# Patient Record
Sex: Female | Born: 1975 | Race: White | Hispanic: No | Marital: Single | Smoking: Former smoker
Health system: Southern US, Community
[De-identification: ages and names within clinical notes are randomized; demographics above are authoritative.]

## PROBLEM LIST (undated history)

## (undated) DIAGNOSIS — M87059 Idiopathic aseptic necrosis of unspecified femur: Secondary | ICD-10-CM

## (undated) DIAGNOSIS — F3181 Bipolar II disorder: Secondary | ICD-10-CM

## (undated) DIAGNOSIS — F329 Major depressive disorder, single episode, unspecified: Secondary | ICD-10-CM

## (undated) DIAGNOSIS — Z9889 Other specified postprocedural states: Secondary | ICD-10-CM

## (undated) DIAGNOSIS — Z973 Presence of spectacles and contact lenses: Secondary | ICD-10-CM

## (undated) DIAGNOSIS — F909 Attention-deficit hyperactivity disorder, unspecified type: Secondary | ICD-10-CM

## (undated) DIAGNOSIS — R112 Nausea with vomiting, unspecified: Secondary | ICD-10-CM

## (undated) DIAGNOSIS — F32A Depression, unspecified: Secondary | ICD-10-CM

## (undated) DIAGNOSIS — F17200 Nicotine dependence, unspecified, uncomplicated: Secondary | ICD-10-CM

## (undated) HISTORY — DX: Attention-deficit hyperactivity disorder, unspecified type: F90.9

## (undated) HISTORY — DX: Idiopathic aseptic necrosis of unspecified femur: M87.059

## (undated) HISTORY — PX: BREAST BIOPSY: SHX20

## (undated) HISTORY — DX: Major depressive disorder, single episode, unspecified: F32.9

## (undated) HISTORY — PX: DILATION AND CURETTAGE OF UTERUS: SHX78

## (undated) HISTORY — DX: Bipolar II disorder: F31.81

## (undated) HISTORY — PX: SHOULDER SURGERY: SHX246

## (undated) HISTORY — PX: WISDOM TOOTH EXTRACTION: SHX21

## (undated) HISTORY — PX: HIP ARTHROSCOPY: SHX668

## (undated) HISTORY — PX: TONSILLECTOMY: SUR1361

## (undated) HISTORY — DX: Depression, unspecified: F32.A

---
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
## 1998-06-15 HISTORY — PX: APPENDECTOMY: SHX54

## 2001-11-15 ENCOUNTER — Other Ambulatory Visit: Admission: RE | Admit: 2001-11-15 | Discharge: 2001-11-15 | Payer: Self-pay | Admitting: Gynecology

## 2002-11-17 ENCOUNTER — Other Ambulatory Visit: Admission: RE | Admit: 2002-11-17 | Discharge: 2002-11-17 | Payer: Self-pay | Admitting: Gynecology

## 2003-01-07 ENCOUNTER — Encounter: Payer: Self-pay | Admitting: Emergency Medicine

## 2003-01-07 ENCOUNTER — Emergency Department (HOSPITAL_COMMUNITY): Admission: EM | Admit: 2003-01-07 | Discharge: 2003-01-07 | Payer: Self-pay | Admitting: Emergency Medicine

## 2004-12-26 ENCOUNTER — Other Ambulatory Visit: Admission: RE | Admit: 2004-12-26 | Discharge: 2004-12-26 | Payer: Self-pay | Admitting: Gynecology

## 2005-10-17 ENCOUNTER — Emergency Department (HOSPITAL_COMMUNITY): Admission: EM | Admit: 2005-10-17 | Discharge: 2005-10-17 | Payer: Self-pay | Admitting: Emergency Medicine

## 2005-12-28 ENCOUNTER — Other Ambulatory Visit: Admission: RE | Admit: 2005-12-28 | Discharge: 2005-12-28 | Payer: Self-pay | Admitting: Gynecology

## 2006-02-08 ENCOUNTER — Ambulatory Visit (HOSPITAL_COMMUNITY): Admission: RE | Admit: 2006-02-08 | Discharge: 2006-02-08 | Payer: Self-pay | Admitting: Internal Medicine

## 2006-06-15 DIAGNOSIS — M87059 Idiopathic aseptic necrosis of unspecified femur: Secondary | ICD-10-CM

## 2006-06-15 HISTORY — DX: Idiopathic aseptic necrosis of unspecified femur: M87.059

## 2006-10-05 ENCOUNTER — Ambulatory Visit (HOSPITAL_COMMUNITY): Admission: RE | Admit: 2006-10-05 | Discharge: 2006-10-05 | Payer: Self-pay | Admitting: Orthopaedic Surgery

## 2006-12-30 ENCOUNTER — Other Ambulatory Visit: Admission: RE | Admit: 2006-12-30 | Discharge: 2006-12-30 | Payer: Self-pay | Admitting: Gynecology

## 2008-01-09 ENCOUNTER — Other Ambulatory Visit: Admission: RE | Admit: 2008-01-09 | Discharge: 2008-01-09 | Payer: Self-pay | Admitting: Gynecology

## 2008-03-08 ENCOUNTER — Encounter (HOSPITAL_COMMUNITY): Admission: RE | Admit: 2008-03-08 | Discharge: 2008-05-24 | Payer: Self-pay | Admitting: Internal Medicine

## 2008-05-18 ENCOUNTER — Ambulatory Visit: Payer: Self-pay | Admitting: Women's Health

## 2008-08-02 ENCOUNTER — Ambulatory Visit: Payer: Self-pay | Admitting: Women's Health

## 2009-01-09 ENCOUNTER — Other Ambulatory Visit: Admission: RE | Admit: 2009-01-09 | Discharge: 2009-01-09 | Payer: Self-pay | Admitting: Gynecology

## 2009-01-09 ENCOUNTER — Encounter: Payer: Self-pay | Admitting: Women's Health

## 2009-01-09 ENCOUNTER — Ambulatory Visit: Payer: Self-pay | Admitting: Women's Health

## 2009-01-21 ENCOUNTER — Encounter: Admission: RE | Admit: 2009-01-21 | Discharge: 2009-01-21 | Payer: Self-pay | Admitting: Gynecology

## 2009-05-14 ENCOUNTER — Ambulatory Visit: Payer: Self-pay | Admitting: Gynecology

## 2009-06-15 HISTORY — PX: BREAST EXCISIONAL BIOPSY: SUR124

## 2009-06-15 HISTORY — PX: BREAST SURGERY: SHX581

## 2009-06-28 ENCOUNTER — Other Ambulatory Visit: Admission: RE | Admit: 2009-06-28 | Discharge: 2009-06-28 | Payer: Self-pay | Admitting: General Surgery

## 2010-01-27 ENCOUNTER — Ambulatory Visit: Payer: Self-pay | Admitting: Women's Health

## 2010-01-27 ENCOUNTER — Other Ambulatory Visit: Admission: RE | Admit: 2010-01-27 | Discharge: 2010-01-27 | Payer: Self-pay | Admitting: Gynecology

## 2010-04-01 ENCOUNTER — Ambulatory Visit: Payer: Self-pay | Admitting: Gynecology

## 2010-04-07 ENCOUNTER — Encounter: Admission: RE | Admit: 2010-04-07 | Discharge: 2010-04-07 | Payer: Self-pay | Admitting: Gynecology

## 2010-05-09 ENCOUNTER — Encounter: Admission: RE | Admit: 2010-05-09 | Discharge: 2010-05-09 | Payer: Self-pay | Admitting: Internal Medicine

## 2010-07-06 ENCOUNTER — Encounter: Payer: Self-pay | Admitting: Internal Medicine

## 2010-11-24 ENCOUNTER — Emergency Department (HOSPITAL_COMMUNITY): Payer: BC Managed Care – PPO

## 2010-11-24 ENCOUNTER — Emergency Department (HOSPITAL_COMMUNITY)
Admission: EM | Admit: 2010-11-24 | Discharge: 2010-11-25 | Disposition: A | Discharge status: Other Acute Inpatient Hospital | Payer: BC Managed Care – PPO | Source: Home / Self Care | Attending: Emergency Medicine | Admitting: Emergency Medicine

## 2010-11-24 LAB — DIFFERENTIAL
Basophils Absolute: 0 10*3/uL (ref 0.0–0.1)
Basophils Relative: 0 % (ref 0–1)
Eosinophils Absolute: 0 10*3/uL (ref 0.0–0.7)
Lymphocytes Relative: 19 % (ref 12–46)
Lymphs Abs: 1.5 10*3/uL (ref 0.7–4.0)
Monocytes Absolute: 0.5 10*3/uL (ref 0.1–1.0)
Monocytes Relative: 6 % (ref 3–12)
Neutro Abs: 5.8 10*3/uL (ref 1.7–7.7)
Neutrophils Relative %: 74 % (ref 43–77)

## 2010-11-24 LAB — CBC
HCT: 41.8 % (ref 36.0–46.0)
Hemoglobin: 14.4 g/dL (ref 12.0–15.0)
MCH: 29.4 pg (ref 26.0–34.0)
MCHC: 34.4 g/dL (ref 30.0–36.0)
MCV: 85.3 fL (ref 78.0–100.0)
Platelets: 178 10*3/uL (ref 150–400)
RBC: 4.9 MIL/uL (ref 3.87–5.11)
RDW: 12.2 % (ref 11.5–15.5)

## 2010-11-24 LAB — POCT PREGNANCY, URINE: Preg Test, Ur: NEGATIVE

## 2010-11-24 LAB — BASIC METABOLIC PANEL
CO2: 24 mEq/L (ref 19–32)
Calcium: 9.3 mg/dL (ref 8.4–10.5)
Chloride: 101 mEq/L (ref 96–112)
Creatinine, Ser: 0.51 mg/dL (ref 0.4–1.2)
GFR calc Af Amer: >60 mL/min (ref >60)
GFR calc non Af Amer: >60 mL/min (ref >60)
Glucose, Bld: 120 mg/dL — ABNORMAL HIGH (ref 70–99)
Potassium: 3.4 mEq/L — ABNORMAL LOW (ref 3.5–5.1)

## 2010-11-24 LAB — URINALYSIS, ROUTINE W REFLEX MICROSCOPIC
Glucose, UA: NEGATIVE mg/dL
Hgb urine dipstick: NEGATIVE
Ketones, ur: 40 mg/dL — AB
Leukocytes, UA: NEGATIVE
Nitrite: NEGATIVE
Specific Gravity, Urine: 1.035 — ABNORMAL HIGH (ref 1.005–1.030)
Urobilinogen, UA: 0.2 mg/dL (ref 0.0–1.0)

## 2010-11-24 LAB — ETHANOL: Alcohol, Ethyl (B): <11 mg/dL — ABNORMAL HIGH (ref 0–10)

## 2010-11-24 LAB — AMMONIA: Ammonia: 26 umol/L (ref 11–60)

## 2010-11-25 ENCOUNTER — Inpatient Hospital Stay (HOSPITAL_COMMUNITY)
Admission: AD | Admit: 2010-11-25 | Discharge: 2010-11-25 | DRG: 430 | Disposition: A | Discharge status: Other Acute Inpatient Hospital | Payer: BC Managed Care – PPO | Source: Ambulatory Visit | Attending: Psychiatry | Admitting: Psychiatry

## 2010-11-25 ENCOUNTER — Inpatient Hospital Stay (HOSPITAL_COMMUNITY)
Admission: EM | Admit: 2010-11-25 | Discharge: 2010-11-27 | DRG: 017 | Disposition: A | Discharge status: Home / Self Care | Payer: BC Managed Care – PPO | Attending: Internal Medicine | Admitting: Internal Medicine

## 2010-11-25 ENCOUNTER — Emergency Department (HOSPITAL_COMMUNITY): Payer: BC Managed Care – PPO

## 2010-11-25 DIAGNOSIS — F17200 Nicotine dependence, unspecified, uncomplicated: Secondary | ICD-10-CM | POA: Diagnosis present

## 2010-11-25 DIAGNOSIS — G894 Chronic pain syndrome: Secondary | ICD-10-CM | POA: Diagnosis present

## 2010-11-25 DIAGNOSIS — F319 Bipolar disorder, unspecified: Secondary | ICD-10-CM

## 2010-11-25 DIAGNOSIS — T403X5A Adverse effect of methadone, initial encounter: Secondary | ICD-10-CM | POA: Diagnosis present

## 2010-11-25 DIAGNOSIS — F909 Attention-deficit hyperactivity disorder, unspecified type: Secondary | ICD-10-CM | POA: Diagnosis present

## 2010-11-25 DIAGNOSIS — G9349 Other encephalopathy: Principal | ICD-10-CM | POA: Diagnosis present

## 2010-11-25 DIAGNOSIS — M87059 Idiopathic aseptic necrosis of unspecified femur: Secondary | ICD-10-CM | POA: Diagnosis present

## 2010-11-25 DIAGNOSIS — F312 Bipolar disorder, current episode manic severe with psychotic features: Secondary | ICD-10-CM

## 2010-11-25 DIAGNOSIS — Z79899 Other long term (current) drug therapy: Secondary | ICD-10-CM

## 2010-11-25 DIAGNOSIS — F29 Unspecified psychosis not due to a substance or known physiological condition: Principal | ICD-10-CM

## 2010-11-25 DIAGNOSIS — F39 Unspecified mood [affective] disorder: Secondary | ICD-10-CM

## 2010-11-25 LAB — RAPID URINE DRUG SCREEN, HOSP PERFORMED
Amphetamines: POSITIVE — AB
Barbiturates: NOT DETECTED
Benzodiazepines: NOT DETECTED
Cocaine: NOT DETECTED
Opiates: NOT DETECTED

## 2010-11-25 LAB — POCT I-STAT, CHEM 8
BUN: 8 mg/dL (ref 6–23)
Calcium, Ion: 1.14 mmol/L (ref 1.12–1.32)
Chloride: 107 mEq/L (ref 96–112)
Creatinine, Ser: 0.5 mg/dL (ref 0.4–1.2)
Glucose, Bld: 106 mg/dL — ABNORMAL HIGH (ref 70–99)
HCT: 44 % (ref 36.0–46.0)
Potassium: 3.6 mEq/L (ref 3.5–5.1)

## 2010-11-26 DIAGNOSIS — F39 Unspecified mood [affective] disorder: Secondary | ICD-10-CM

## 2010-11-26 LAB — DIFFERENTIAL
Basophils Relative: 0 % (ref 0–1)
Eosinophils Relative: 0 % (ref 0–5)
Lymphocytes Relative: 29 % (ref 12–46)
Lymphs Abs: 2.9 10*3/uL (ref 0.7–4.0)
Monocytes Absolute: 0.8 10*3/uL (ref 0.1–1.0)
Monocytes Relative: 8 % (ref 3–12)
Neutro Abs: 6.2 10*3/uL (ref 1.7–7.7)
Neutrophils Relative %: 63 % (ref 43–77)

## 2010-11-26 LAB — COMPREHENSIVE METABOLIC PANEL
Albumin: 3.3 g/dL — ABNORMAL LOW (ref 3.5–5.2)
Alkaline Phosphatase: 91 U/L (ref 39–117)
BUN: 6 mg/dL (ref 6–23)
Calcium: 8.8 mg/dL (ref 8.4–10.5)
Glucose, Bld: 119 mg/dL — ABNORMAL HIGH (ref 70–99)
Potassium: 3.4 mEq/L — ABNORMAL LOW (ref 3.5–5.1)
Total Protein: 5.9 g/dL — ABNORMAL LOW (ref 6.0–8.3)

## 2010-11-26 LAB — CBC
MCH: 29.3 pg (ref 26.0–34.0)
Platelets: 161 10*3/uL (ref 150–400)
RBC: 4.44 MIL/uL (ref 3.87–5.11)
RDW: 12.5 % (ref 11.5–15.5)
WBC: 9.9 10*3/uL (ref 4.0–10.5)

## 2010-11-26 LAB — T4: T4, Total: 4.8 ug/dL — ABNORMAL LOW (ref 5.0–12.5)

## 2010-11-26 LAB — RPR: RPR Ser Ql: NONREACTIVE

## 2010-11-26 LAB — LACTATE DEHYDROGENASE: LDH: 132 U/L (ref 94–250)

## 2010-11-26 LAB — PHOSPHORUS: Phosphorus: 2.2 mg/dL — ABNORMAL LOW (ref 2.3–4.6)

## 2010-11-26 LAB — FERRITIN: Ferritin: 76 ng/mL (ref 10–291)

## 2010-11-26 LAB — LIPID PANEL
Cholesterol: 166 mg/dL (ref 0–200)
Triglycerides: 89 mg/dL (ref <150)

## 2010-11-26 LAB — T3, FREE: T3, Free: 2.1 pg/mL — ABNORMAL LOW (ref 2.3–4.2)

## 2010-11-26 LAB — HEMOGLOBIN A1C: Hgb A1c MFr Bld: 5.4 % (ref <5.7)

## 2010-11-26 LAB — TSH: TSH: 1.135 u[IU]/mL (ref 0.350–4.500)

## 2010-11-26 LAB — LACTIC ACID, PLASMA: Lactic Acid, Venous: 0.7 mmol/L (ref 0.5–2.2)

## 2010-11-26 LAB — MAGNESIUM: Magnesium: 1.9 mg/dL (ref 1.5–2.5)

## 2010-11-27 LAB — FOLATE RBC: RBC Folate: 1115 ng/mL — ABNORMAL HIGH (ref >=366)

## 2010-12-02 NOTE — Consult Note (Signed)
  Alice [name], FORKER             ACCOUNT NO.:  [id number]  MEDICAL RECORD NO.:  [id number]  LOCATION:  1510                         FACILITY:  [facility]  PHYSICIAN:  Eulogio Ditch, MD DATE OF BIRTH:  31-Aug-1975  DATE OF CONSULTATION:  11/26/2010 DATE OF DISCHARGE:                                CONSULTATION   REASON FOR CONSULTATION:  History of bipolar disorder/confusion/psychotic behavior.  HISTORY OF PRESENT ILLNESS:  This is a 35 year old female with a history of bipolar disorder and chronic pain along with right hip avascular necrosis.  The patient came to the ER yesterday and was admitted to [facility] for disorganized behavior.  Because of the family concern, the patient was sent back to ER and admitted on the medical floor to rule out stroke.  Her CT scan was negative when she was at [facility] and all her labs were within normal limits.  Today when I saw the patient, the patient is very logical and goal directed, not internally preoccupied, not delusional or hallucinating. The patient denied any overdose of the medications.  The patient reported that recently she was started on methadone for 2-1/2 weeks 5 mg 3 times a day and she was taking as prescribed medications along with Xanax, Adderall, and Cymbalta and she do not know what happened and why she became so confused.  Again, the patient stressed that it was not a suicide attempt and she was not overmedicating herself.  The patient follows Dr. Ellis Savage as an outpatient psychiatrist.  MENTAL STATUS EXAMINATION:  The patient is very calm and cooperative during the interview, logical, and goal directed.  No flight of ideas present.  Not delusional or hallucinating, not internally preoccupied, alert, awake, and oriented x3.  Attention and concentration good. Memory, immediate, recent, and remote intact.  Fund of knowledge fair. Insight and judgment intact.  DIAGNOSES:  Axis I:  As per history,  bipolar disorder. Axis II:  Deferred. Axis III:  See medical notes. Axis IV:  Unspecified. Axis V:  50.  RECOMMENDATIONS: 1. The patient at this time is cleared from Psychiatry to follow up in     the outpatient setting.  She is not acutely     manic or psychotic, to be admitted in the inpatient setting at     [facility]. 2. The patient's family, the boyfriend and mother were agreeable for     the treatment plan.  Thank you for involving me in taking care of this patient.     Eulogio Ditch, MD     SA/MEDQ  D:  11/26/2010  T:  11/26/2010  Job:  [id number]  Electronically Signed by Eulogio Ditch  on 12/02/2010 09:56:37 AM

## 2010-12-18 NOTE — Discharge Summary (Signed)
Alice [name], Alice [name]             ACCOUNT NO.:  [id number]  MEDICAL RECORD NO.:  [id number]  LOCATION:  1510                         FACILITY:  [facility]  PHYSICIAN:  Clydia Llano, MD       DATE OF BIRTH:  1975-09-20  DATE OF ADMISSION:  11/25/2010 DATE OF DISCHARGE:                              DISCHARGE SUMMARY   PRIMARY CARE PHYSICIAN:  Gwen Pounds, MD  REASON FOR ADMISSION:  Encephalopathy.  DISCHARGE DIAGNOSES: 1. Drug-induced encephalopathy, resolved. 2. Bipolar affective disorder. 3. Chronic pain syndrome. 4. Right hip avascular necrosis. 5. Attention deficit hyperactivity disorder. 6. Tobacco abuse.  DISCHARGE MEDICATIONS: 1. Adderall XR 30 mg p.o. daily. 2. Cymbalta 60 mg p.o. b.i.d. 3. Lamictal 25 mg p.o. b.i.d. 4. Methadone 5 mg p.o. three times daily, do not take it until she     sees pain management clinic. 5. Nucynta 75 mg 2 tablets four times daily. 6. Xanax 1 mg p.o. three times a day.  BRIEF HISTORY EXAMINATION:  Alice [name] is a 35 year old female with past medical history of bipolar disorder; chronic pain, on chronic narcotics. The patient has right hip avascular necrosis who was started on methadone 2-1/2 weeks prior to admission.  The patient brought to the hospital because of odd behavior.  The patient has been persistently sleepy and lethargic.  Unable to talk or eat or drink anything 3 days prior to admission.  The patient brought to the emergency department here, we evaluated with CT scan which was negative.  Because of history of bipolar disorder, the patient was transferred to [facility].  The patient continued to act not herself and she has some facial drooling.  So because of concern of family, the patient was sent back again to the emergency department.  The patient remained lethargic at the time of reassessment in the emergency department.  MRI was done which was negative.  The patient admitted to the hospital for  further evaluation.  RADIOLOGY: 1. MRI of the brain November 25, 2010, showed negative exam within limits     of motion degraded study.  There is no visible changes from the     prior CT on November 24, 2010. 2. CT head on November 24, 2010, showed negative CT head.  BRIEF HOSPITAL COURSE: 1. Encephalopathy, drug induced, likely secondary to the methadone.     The patient is taking the methadone for around 17 days and then she     had total lethargy with altered level of consciousness.  The     patient was off the methadone for 2 days and she regained full     consciousness and she is back to herself.  The patient instructed     not to take the methadone until she sees her pain medication clinic     as soon as possible.  The CT scan and the MRI was negative for any     intracranial abnormalities.  The patient has no signs or symptoms     or lab findings that supports infection or other pathology. 2. Bipolar disorder.  The patient seems she is in manic phase but she     was evaluated by Dr.  Ahluwalia from Psychiatry who found the     patient to be logical, goal directed, not internally preoccupied or     delusional and he cleared her to continue to follow up with     outpatient psychiatry.  The patient to follow up with her primary     psychiatrist for this. 3. Chronic pain/chronic narcotic use.  The patient will be on Nucynta.     The patient has been 3 days off the methadone.  It is thought to be     that 5 mg 3 times a day is high dose for her and likely the patient     has build up of methadone in her system.  The patient off methadone     practically since Sunday, so for now for 5 days.  The patient     instructed not to take her methadone until she sees her pain     management clinic.  The patient states she has some Nucynta left at     home and she can manage until she sees pain management clinic.  DISCHARGE INSTRUCTIONS: 1. Activity, as tolerated. 2. Disposition, home. 3. Diet,  regular.     Clydia Llano, MD     ME/MEDQ  D:  11/27/2010  T:  11/27/2010  Job:  [id number]  cc:   Gwen Pounds, MD Fax: [phone]  Ellis Savage, FNP Fax: [phone]  Electronically Signed by Clydia Llano  on 12/18/2010 01:55:15 PM

## 2010-12-23 NOTE — H&P (Signed)
Alice [name], [name]             ACCOUNT NO.:  [id number]  MEDICAL RECORD NO.:  [id number]  LOCATION:  WLED                         FACILITY:  [facility]  PHYSICIAN:  Valetta Close, M.D.   DATE OF BIRTH:  Apr 03, 1976  DATE OF ADMISSION:  11/25/2010 DATE OF DISCHARGE:                             HISTORY & PHYSICAL   CHIEF COMPLAINT:  Encephalopathy.  HISTORY OF PRESENT ILLNESS:  This is a 35 year old female with a history of bipolar disorder, chronic pain, and right hip avascular necrosis who was started on methadone 2-1/2 weeks ago for avascular necrosis.  She has failed other pain regimen.  She has also been on a regimen of gradually increasing her Lamictal because at a higher dose it caused her rash, but they have been trying to get her back on it.  Over the last 2- 1/2 weeks, she has been getting gradually more sleep, although that is persistent problem for her but over the last 2 days, she has gotten much more sleepy, unable to talk, not eating or drinking anything practically for 3 days and just certainly not acting herself.  Because she got to the point where she was so bad, they brought her to the emergency room and she was admitted to [facility] after a negative CT and otherwise negative workup.  However, at [facility] today, she has continued to not act herself.  She is contorting her face to the right looking upward to the right.  Family was concerned for a stroke and because of this, she was referred back to the emergency room where an MRI has been negative but she remained encephalopathic, not doing well with the Mini-Mental status exam and continues to contort her face, just act generally bizarre per her family and not acting herself, so she is referred to [place] for admission.  She has had decreased bowel movements, decreased urination, decreased p.o. intake, but she has had no fevers; however, she has been somewhat diaphoretic.  There have been no changes in  her skin tone, no sick contacts, no exposures to tics, no recent travel.  PAST MEDICAL HISTORY:  Bipolar disorder, chronic pain from a right hip AVN and a suspicion that she may have had fibromyalgia, ADHD.  She recently stopped taking her birth control pills about 2-1/2 weeks ago. Tobacco abuse.  No alcohol.  No controlled substances.  She is a full code.  FAMILY HISTORY:  Performed and notable for Parkinson disease.  She presents with multiple family members and has a very good support system.  REVIEW OF SYSTEMS:  A 10-point review of system was performed and negative for other than as per the HPI.  PHYSICAL EXAMINATION:  VITAL SIGNS:  Temperature 99.2, heart rate 84, blood pressure 133/86, O2 sat 100% on room air, respiratory rate is 20. Fingerstick blood glucose was 77. GENERAL:  She appears her stated age and is in no apparent distress. She does have somewhat tanned skin that family states is at her baseline. HEENT:  She has no scleral icterus. LUNGS:  Clear to auscultation bilaterally.  She is able to speak in full sentences without difficulty. CARDIAC:  Regular rate and rhythm with no murmur. ABDOMEN:  Bowel sounds  are positive.  No tenderness, rebound, or guarding.  No hepatosplenomegaly. EXTREMITIES:  No edema. GU:  No CVA tenderness. RECTAL:  Deferred. NEURO:  She is alert, oriented to person, place, and time.  She is a bit slow to answer questions.  She does follow commands, but again with difficulty and this difficulty is inconsistent.  She does not have cogwheel rigidity and she does not have asterixis.  She has good strength and normal muscle tone.  There is no neck stiffness and no neck tenderness.  She has negative meningeal signs.  At rest though, she does tend to flex her chin to the right and she looks also to the right, although the upper outer quadrant is where she looks but with redirection she will look straight ahead and again appear normal.   She recognizes when asked that this would be an uncomfortable position, but she does not know that it is uncomfortable and she does sometime laugh inappropriately, though I would not say historically.  She remains quite pleasant.  Family says she is gradually getting a little better, but she is still nowhere near her baseline.  LABORATORY DATA:  Sodium 140, potassium 3.6, chloride 107, bicarb 20 with anion gap 13, BUN 8, creatinine 0.5, glucose 106.  White count 7.8, hemoglobin 14, hematocrit 42, platelets 178.  MRI of the head is negative.  CT of the head is negative.  UDS is positive for amphetamines only, not positive for opioids, although she is on methadone.  The UA is negative.  A urine pregnancy is negative.  Her ammonia level is normal.  ASSESSMENT AND PLAN: 1. Acute encephalopathy.  Certainly, it appears that this is     medication side effects.  There may be another medication taken     that is not on her list, but the family seems to really note she is     taking and they believe the list is complete.  Either way, I did     discuss the case with Psychiatry.  We are going to put her on     Benadryl.  I am going to start her on Lamictal, but at the lowest     dose 25.  I am going to stop her Adderall completely.  I am going     to stop her Cymbalta completely as well.  I will put her on Xanax 1     mg twice a day to prevent benzo withdrawal.  I am going to give her     Tylenol.  I am just going to hydrate her and provide supportive     care.  If her symptoms persist or her lab work worsens, especially     her bicarb level, then we may need a Neurology consult and perform     further test.  I am going to screen for hemochromatosis.  I am     going to screen for Wilson disease with ferritin and ceruloplasmin     respectively.  I am going to check ammonia and LDH.  I am going to     screen for thyroid functions knowing that she has an office visit     for a TSH level that was  abnormal in September 2009.  When I look     in the computer system, unfortunately I do not have access to     whatever that test was, so I am going to screen her for thyroid     disease, although again  she has no proptosis, but again we will     screen for thyroid disease, cannot say about the medication would     make this test abnormal.  Psychiatry has been consulted and their     assistance is appreciated.  They will come by tomorrow and I did     discuss the case over the phone with them today and again if she     does not improve, a Neurology consult may be indicated. 2. Bipolar disorder.  Again per psych, we will determine whether she     is to be discharged from the hospital to [facility] and we     will see if we can may be put her on some alternative medications     but I will defer that to them. 3. Tobacco abuse.  I will put her on a NicoDerm patch. 4. Disposition.  We will keep her on telemetry floor for at least 24     hours to make sure heart rate is okay before stopping the     telemetry.  I did discuss with the husband the concern for taking     birth control while smoking and the increased risk of clotting.  He     said he is aware of this and this is why he has encouraged her to     stop taking the birth control and again her urine pregnancy test is     negative. 5. Constipation.  I am going to give her an one-time dose of MiraLax     and Senokot-S twice a day.  MEDICATIONS: 1. Cymbalta that she is only taking off and on. 2. Xanax as needed. 3. Adderall 30 mg once a day. 4. Lamictal 2 tablets in the morning, 1 tablet at night but they are     not sure of the dose off hand and she is titrating     this up. 5. Methadone 5 mg by mouth once a day.  Approximate length of this admission was approximately 45 minutes.     Valetta Close, M.D.     JC/MEDQ  D:  11/25/2010  T:  11/25/2010  Job:  [id number]  cc:   Behavioral Health Gwen Pounds, MD Nadyne Coombes.  Fontaine, M.D.  Electronically Signed by Valetta Close M.D. on 12/23/2010 12:26:58 PM

## 2010-12-25 NOTE — H&P (Deleted)
  NAMELATASHIA, [name]             ACCOUNT NO.:  [id number]  MEDICAL RECORD NO.:  [id number]  LOCATION:                                FACILITY:  [facility]  PHYSICIAN:  Eulogio Ditch, MD DATE OF BIRTH:  [birth year]  DATE OF ADMISSION:  11/25/2010 DATE OF DISCHARGE:  11/25/2010                             HISTORY & PHYSICAL   DATE OF ASSESSMENT:  11/25/2010  IDENTIFYING INFORMATION:  This is a 35 year old female who presented with confused thoughts, appeared disoriented and with thought blocking and is followed by psychotherapist in town.  She denied any suicidal or homicidal thoughts and had denied any history of previous suicide attempts and was referred for admission with altered mental status.  She was a transferred to [facility] Emergency Room for medical evaluation to rule out acute medical problems and did not return to the unit.  DISCHARGE DIAGNOSIS:  Altered mental status not otherwise specified.  DISCHARGE DISPOSITION:  Discharged to the emergency room for medical evaluation.     Raissa A. Lorin Picket, N.P.   ______________________________ Eulogio Ditch, MD    MAS/MEDQ  D:  12/25/2010  T:  12/25/2010  Job:  [id number]  Electronically Signed by Kari Baars N.P. on 12/30/2010 03:03:31 PM Electronically Signed by Eulogio Ditch  on 01/06/2011 09:34:04 AM

## 2011-01-06 NOTE — Discharge Summary (Signed)
  [name], [name]             ACCOUNT NO.:  [id number]  MEDICAL RECORD NO.:  [id number]  LOCATION:                                FACILITY:  [facility]  PHYSICIAN:  Eulogio Ditch, MD DATE OF BIRTH:  03/20/1976  DATE OF ADMISSION:  11/25/2010 DATE OF DISCHARGE:  11/27/2010                              DISCHARGE SUMMARY   IDENTIFYING INFORMATION:  This is a 35 year old female.  This is a voluntary admission.  HISTORY OF PRESENT ILLNESS:  Alice [name] presented on our unit with some confused thoughts, appeared disoriented and significant thought blocking.  Denying any suicidal or homicidal thoughts.  She had not been previously medically cleared and there was concern about her altered mental status.  She was subsequently transferred to the was the [facility] Emergency Room for medical evaluation and did not return to the unit.  DISCHARGE DIAGNOSIS:  Altered mental status NOS.  DISCHARGE/PLAN:  Transferred to the [facility] Emergency Room for medical evaluation     Alice [name], N.P.   ______________________________ Eulogio Ditch, MD    MAS/MEDQ  D:  12/25/2010  T:  12/25/2010  Job:  [id number]  Electronically Signed by Kari Baars N.P. on 12/30/2010 03:03:26 PM Electronically Signed by Eulogio Ditch  on 01/06/2011 09:34:00 AM

## 2011-01-06 NOTE — Assessment & Plan Note (Signed)
  [name], Alice [name]             ACCOUNT NO.:  [id number]  MEDICAL RECORD NO.:  [id number]  LOCATION:                                FACILITY:  [facility]  PHYSICIAN:  Eulogio Ditch, MD DATE OF BIRTH:  08/13/1975  DATE OF ADMISSION:  11/25/2010 DATE OF DISCHARGE:  11/25/2010                             HISTORY & PHYSICAL   DATE OF ASSESSMENT:  11/25/2010  IDENTIFYING INFORMATION:  This is a 35 year old female who presented with confused thoughts, appeared disoriented and with thought blocking and is followed by psychotherapist in town.  She denied any suicidal or homicidal thoughts and had denied any history of previous suicide attempts and was referred for admission with altered mental status.  She was a transferred to [facility] Emergency Room for medical evaluation to rule out acute medical problems and did not return to the unit.  DISCHARGE DIAGNOSIS:  Altered mental status not otherwise specified.  DISCHARGE DISPOSITION:  Discharged to the emergency room for medical evaluation.     Kamala A. Lorin Picket, N.P.   ______________________________ Eulogio Ditch, MD    MAS/MEDQ  D:  12/25/2010  T:  12/25/2010  Job:  [id number]  Electronically Signed by Kari Baars N.P. on 12/30/2010 03:03:31 PM Electronically Signed by Eulogio Ditch  on 01/06/2011 09:34:04 AM

## 2011-01-26 ENCOUNTER — Other Ambulatory Visit: Payer: Self-pay | Admitting: *Deleted

## 2011-01-26 MED ORDER — NORETHIN ACE-ETH ESTRAD-FE 1-20 MG-MCG(24) PO TABS: 1.0000 | ORAL_TABLET | Freq: Every day | ORAL | Status: DC

## 2011-01-29 ENCOUNTER — Encounter: Payer: Self-pay | Admitting: Women's Health

## 2011-01-29 ENCOUNTER — Other Ambulatory Visit (HOSPITAL_COMMUNITY)
Admission: RE | Admit: 2011-01-29 | Discharge: 2011-01-29 | Disposition: A | Discharge status: Home / Self Care | Payer: BC Managed Care – PPO | Source: Ambulatory Visit | Attending: Women's Health | Admitting: Women's Health

## 2011-01-29 ENCOUNTER — Ambulatory Visit (INDEPENDENT_AMBULATORY_CARE_PROVIDER_SITE_OTHER): Payer: BC Managed Care – PPO | Admitting: Women's Health

## 2011-01-29 VITALS — BP 100/60 | Ht 63.5 in | Wt 115.0 lb

## 2011-01-29 DIAGNOSIS — IMO0001 Reserved for inherently not codable concepts without codable children: Secondary | ICD-10-CM

## 2011-01-29 DIAGNOSIS — Z01419 Encounter for gynecological examination (general) (routine) without abnormal findings: Secondary | ICD-10-CM

## 2011-01-29 DIAGNOSIS — Z309 Encounter for contraceptive management, unspecified: Secondary | ICD-10-CM

## 2011-01-29 MED ORDER — NORETHIN ACE-ETH ESTRAD-FE 1-20 MG-MCG PO TABS: 1.0000 | ORAL_TABLET | Freq: Every day | ORAL | Status: DC

## 2011-01-29 NOTE — Progress Notes (Signed)
Alice [name] 1975/08/23 [id number]    History:    The patient presents for annual exam.  Has a history of numerous medical problems. Has necrosis of the right hip, has had grafts to the bone, and is going to need a hip replacement. She does have chronic pain intensity of pain management physician. Also history of bipolar disease and is on numerous medications per her psychiatrist. She is currently out of work and seeking disability. She was hospitalized in June of 2012 due to confusion caused by probable medication problems/ methadone sensitivity.   Past medical history, past surgical history, family history and social history were all reviewed and documented in the EPIC chart.   ROS:  A  ROS was performed and pertinent positives and negatives are included in the history.  Exam:  Filed Vitals:   01/29/11 1400  BP: 100/60    General appearance:  Normal Head/Neck:  Normal, without cervical or supraclavicular adenopathy. Thyroid:  Symmetrical, normal in size, without palpable masses or nodularity. Respiratory  Effort:  Normal  Auscultation:  Clear without wheezing or rhonchi Cardiovascular  Auscultation:  Regular rate, without rubs, murmurs or gallops  Edema/varicosities:  Not grossly evident Abdominal   Soft,nontender, without masses, guarding or rebound.  Liver/spleen:  No organomegaly noted  Hernia:  None appreciated  Occult test:   Skin  Inspection:  Grossly normal  Palpation:  Grossly normal Neurologic/psychiatric  Orientation:  Normal with appropriate conversation.  Mood/affect:  Normal  Genitourinary    Breasts: Examined lying and sitting.     Right: Without masses, retractions, discharge or axillary adenopathy.     Left: Without masses, retractions, discharge or axillary adenopathy.   Inguinal/mons:  Normal without inguinal adenopathy  External genitalia:  Normal  BUS/Urethra/Skene's glands:  Normal  Bladder:  Normal  Vagina:  Normal  Cervix:   Normal  Uterus:  normal in size, shape and contour.  Midline and mobile  Adnexa/parametria:     Rt: Without masses or tenderness.   Lt: Without masses or tenderness.  Anus and perineum: Normal  Digital rectal exam: Normal sphincter tone without palpated masses or tenderness  Assessment/Plan:  34 y.o.SWF G0 for annual exam.   Currently on Loestrin 24 FE for cycle control. She states it has helped her cycles to be only 5 days, but complained of cost, will try generic Loestrin 1/20. Reviewed slight risks of blood clots strokes. Prescription proper use was given, will call if problems with the change. SBEs, MVI, exercise were encouraged. She had numerous labs drawn when she was in the hospital in June declines need for any labs at this time. Pap only today. She is not planning on any children due to her health problems. Did review IUDs but declines at this time. She does see a psychiatrist for her medications and counseling for her bipolar disease and will continue.    Harrington Challenger [facility], 4:51 PM 01/29/2011

## 2011-05-06 DIAGNOSIS — M25569 Pain in unspecified knee: Secondary | ICD-10-CM | POA: Insufficient documentation

## 2011-06-16 HISTORY — PX: TOTAL HIP ARTHROPLASTY: SHX124

## 2011-07-16 DIAGNOSIS — Z8639 Personal history of other endocrine, nutritional and metabolic disease: Secondary | ICD-10-CM | POA: Insufficient documentation

## 2011-08-12 DIAGNOSIS — Z96649 Presence of unspecified artificial hip joint: Secondary | ICD-10-CM | POA: Insufficient documentation

## 2011-12-31 ENCOUNTER — Encounter: Payer: BC Managed Care – PPO | Admitting: Women's Health

## 2012-01-07 ENCOUNTER — Other Ambulatory Visit: Payer: Self-pay | Admitting: *Deleted

## 2012-01-07 DIAGNOSIS — IMO0001 Reserved for inherently not codable concepts without codable children: Secondary | ICD-10-CM

## 2012-01-07 MED ORDER — NORETHIN ACE-ETH ESTRAD-FE 1-20 MG-MCG PO TABS: 1.0000 | ORAL_TABLET | Freq: Every day | ORAL | Status: DC

## 2012-02-04 ENCOUNTER — Other Ambulatory Visit: Payer: Self-pay | Admitting: *Deleted

## 2012-02-04 DIAGNOSIS — IMO0001 Reserved for inherently not codable concepts without codable children: Secondary | ICD-10-CM

## 2012-02-04 MED ORDER — NORETHIN ACE-ETH ESTRAD-FE 1-20 MG-MCG PO TABS: 1.0000 | ORAL_TABLET | Freq: Every day | ORAL | Status: DC

## 2016-10-12 ENCOUNTER — Encounter (INDEPENDENT_AMBULATORY_CARE_PROVIDER_SITE_OTHER): Payer: Self-pay | Admitting: Physician Assistant

## 2016-10-12 ENCOUNTER — Ambulatory Visit (INDEPENDENT_AMBULATORY_CARE_PROVIDER_SITE_OTHER): Payer: 59 | Admitting: Physician Assistant

## 2016-10-12 VITALS — BP 124/77 | HR 87 | Temp 98.2°F | Ht 63.0 in | Wt 127.4 lb

## 2016-10-12 DIAGNOSIS — F988 Other specified behavioral and emotional disorders with onset usually occurring in childhood and adolescence: Secondary | ICD-10-CM | POA: Diagnosis not present

## 2016-10-12 MED ORDER — DEXMETHYLPHENIDATE HCL ER 30 MG PO CP24: 1.0000 | ORAL_CAPSULE | Freq: Every day | ORAL | 0 refills | Status: DC

## 2016-10-12 NOTE — Progress Notes (Signed)
Subjective:  Patient ID: Alice [name], female    DOB: June 12, 1976  Age: 41 y.o. MRN: [id number]  CC: Needs ADD meds  HPI Alice [name] is a 41 y.o. female with a PMH of ADD, Bipolar disorder, and avascular necrosis presents to have ADD medications refilled. She brings her records from [place] [facility]. She has recently moved back due to a failed relationship and has now returned to [facility]. She is re-establishing care here with psychiatry and has an appointment soon with psychiatry. Records show psychiatry has her on Adderall IR and Focalin XR for ADD. She also takes Cymbalta and Lamotragine for Bipolar disorder. States she has enough of all her meds except Focalin XR. Denies any symptoms to include chest pain, palpitations, SOB, HA, appetite changes, mood changes, restlessness, f/c/n/v, or abdominal pain.    Outpatient Medications Prior to Visit  Medication Sig Dispense Refill  . LamoTRIgine (LAMICTAL PO) Take 200 mg by mouth.     . ALPRAZolam (XANAX PO) Take by mouth as needed.     . Amphetamine-Dextroamphetamine (ADDERALL XR PO) Take 20 mg by mouth.     . DULoxetine HCl (CYMBALTA PO) Take by mouth.      Marland Kitchen HYDROmorphone HCl (EXALGO PO) Take by mouth.      . norethindrone-ethinyl estradiol (JUNEL FE,GILDESS FE,LOESTRIN FE) 1-20 MG-MCG tablet Take 1 tablet by mouth daily. 1 Package 0  . oxyCODONE-acetaminophen (PERCOCET) 10-325 MG per tablet Take 1 tablet by mouth every 4 (four) hours as needed.      . Tapentadol HCl (NUCYNTA ER) 200 MG TB12 Take by mouth.      . temazepam (RESTORIL) 30 MG capsule Take 30 mg by mouth. 2 tabs qhs      No facility-administered medications prior to visit.      ROS Review of Systems  Constitutional: Negative for chills, fever and malaise/fatigue.  Eyes: Negative for blurred vision.  Respiratory: Negative for shortness of breath.   Cardiovascular: Negative for chest pain and palpitations.  Gastrointestinal: Negative for abdominal pain and nausea.   Genitourinary: Negative for dysuria and hematuria.  Musculoskeletal: Negative for joint pain and myalgias.  Skin: Negative for rash.  Neurological: Negative for tingling and headaches.  Psychiatric/Behavioral: Negative for depression. The patient is not nervous/anxious.     Objective:  BP 124/77 (BP Location: Right Arm, Patient Position: Sitting, Cuff Size: Normal)   Pulse 87   Temp 98.2 F (36.8 C) (Oral)   Ht  (1.6 m)   Wt 127 lb 6.4 oz (57.8 kg)   LMP 10/09/2016 (Exact Date)   SpO2 95%   BMI 22.57 kg/m   BP/Weight 10/12/2016 01/29/2011  Systolic BP 124 100  Diastolic BP 77 60  Wt. (Lbs) 127.4 115  BMI 22.57 20.05      Physical Exam  Constitutional: She is oriented to person, place, and time.  Well developed, well nourished, NAD, polite  HENT:  Head: Normocephalic and atraumatic.  Eyes: No scleral icterus.  Neck: Normal range of motion. No thyromegaly present.  Cardiovascular: Normal rate, regular rhythm and normal heart sounds.   Pulmonary/Chest: Effort normal and breath sounds normal.  Musculoskeletal: She exhibits no edema.  Neurological: She is alert and oriented to person, place, and time. No cranial nerve deficit. Coordination normal.  Skin: Skin is warm and dry. No rash noted. No erythema. No pallor.  Psychiatric: She has a normal mood and affect. Her behavior is normal. Thought content normal.  Vitals reviewed.    Assessment & Plan:  1. Attention deficit disorder, unspecified hyperactivity presence - Dexmethylphenidate HCl (FOCALIN XR) 30 MG CP24; Take 1 capsule (30 mg total) by mouth daily.  Dispense: 30 capsule; Refill: 0   Meds ordered this encounter  Medications  . Dexmethylphenidate HCl (FOCALIN XR) 30 MG CP24    Sig: Take 1 capsule (30 mg total) by mouth daily.    Dispense:  30 capsule    Refill:  0    Order Specific Question:   Supervising Provider    Answer:   Quentin Angst [id number]    Follow-up: Return if symptoms worsen or  fail to improve.   Loletta Specter PA

## 2016-10-12 NOTE — Patient Instructions (Signed)
Stimulant Use Disorder-Amphetamines Amphetamines belong to a group of powerful drugs known as stimulants. Common street names for amphetamines include speed and crank. Amphetamines have a number of medical uses, but they are often misused because of the effects that they produce. These effects include:  A feeling of extreme pleasure (euphoria).  Alertness.  Increased attention.  A high energy level.  Loss of appetite. Stimulant use disorder is when your stimulant use disrupts your daily life. It may disrupt your relationships and how you do your job. Stimulant use disorder can be dangerous. Amphetamines increase your blood pressure and heart rate. Using them can lead to a heart attack or stroke. Amphetamines can also make your heart rate irregular, cause seizures, and raise your body temperature. These problems can lead to death. What are the causes? This condition is caused by misusing amphetamines for a period of time, such as by taking them for reasons other than to treat a diagnosed problem. Many people start using amphetamines because they make them feel good. Over time, they get addicted to them. When they try to stop using them, they feel sick. What increases the risk? This condition is more likely to develop in people who:  Misuse other drugs.  Have problems with mood or behavior. What are the signs or symptoms? Symptoms of this condition include:  Using greater amounts of an amphetamine than you want to, or using an amphetamine for longer than you want to.  Trying several times to use less of an amphetamine or to control your amphetamine use.  Craving amphetamines.  Spending a lot of time getting amphetamines, using them, or recovering from their effects.  Having problems at work, at school, at home, or in relationships because of amphetamine use.  Giving up or cutting down on important life activities because of amphetamine use.  Using amphetamines when it is dangerous,  such as when driving a car.  Continuing to use amphetamines even though they are causing or have led to a physical problem, such as:  Unintended weight loss.  High blood pressure.  Chest pain.  An infection, such as hepatitis or HIV (human immunodeficiency virus).  Continuing to use amphetamines even though they are causing a mental problem, such as:  Anxiety.  Sleep problems.  Schizophrenia-like symptoms.  Depression.  Bipolar mood swings.  Violent behavior.  Needing more and more of an amphetamine to get the same effect that you want (building up a tolerance).  Having symptoms of withdrawal when you stop using an amphetamine. Symptoms of withdrawal include:  Headache.  Chills.  Palpitations or an irregular heart beat.  Changes in blood pressure.  Chest pain.  Dry mouth or changes in taste.  Cramping in the abdomen.  Nausea, vomiting, or diarrhea.  Mood changes.  Fatigue.  Sleep changes or bad dreams.  Increased appetite.  Restlessness. How is this diagnosed? This condition is diagnosed with an assessment. During the assessment, your health care provider will ask about your amphetamine use and about how it affects your life. You will be diagnosed with the condition if you have had at least two symptoms of this condition within a 26-month period. How severe the condition is depends on how many symptoms you have:  If you have 2 or 3 symptoms, your condition is mild.  If you have 4 or 5 symptoms, your condition is moderate.  If you have 6 or more symptoms, your condition is severe. Your health care provider may perform a physical exam or do lab  tests to see if you have physical problems resulting from amphetamine use. Your health care provider may also screen for drug use and refer you to a mental health professional for evaluation. How is this treated? There are two types of treatment for this condition:  Short-term medical treatment. This type helps  you live your life. It helps prevent or minimize damage from any physical or mental problems that are related to your amphetamine use.  Long-term substance abuse treatment. This type helps you stop using amphetamines. It is usually provided by mental health professionals with training in substance use disorders. It usually involves a combination of the following:  Counseling. This treatment is also called talk therapy. It is provided by substance use treatment counselors. A counselor can address the reasons you use amphetamines and suggest ways to keep you from using amphetamines again. The goals of talk therapy are to find healthy activities and ways to cope with stress, identify and avoid what triggers your amphetamine use, and help you learn how to handle cravings.  Support groups. Support groups are run by people who have quit using stimulants. They provide emotional support, advice, and guidance.  Medicine. Medicines may be prescribed to reduce cravings or to block the good feeling that you get from using amphetamines. Follow these instructions at home:  Take over-the-counter and prescription medicines only as told by your health care provider.  Check with your health care provider before starting any new medicines.  Keep all follow-up visits as told by your health care provider. This is important. Where to find more information:  [employer] on Drug Abuse: http://www.price-smith.com/  Substance Abuse and Mental Health Services Administration: SkateOasis.com.pt Contact a health care provider if:  Your symptoms get worse.  You use amphetamines again.  You are not able to take medicines as told. Get help right away if:  You have serious thoughts about hurting yourself or others.  You have a seizure.  You have chest pain.  You have sudden weakness.  You lose some of your vision.  You lose some of your speech. If you ever feel like you may hurt yourself or others, or have thoughts  about taking your own life, get help right away. You can go to your nearest emergency department or call:  Your local emergency services (911 in the [place]  A suicide crisis helpline, such as the National Suicide Prevention Lifeline at (303) 476-4616. This is open 24 hours a day. This information is not intended to replace advice given to you by your health care provider. Make sure you discuss any questions you have with your health care provider. Document Released: 05/26/2001 Document Revised: 03/13/2016 Document Reviewed: 03/13/2016 Elsevier Interactive Patient Education  2017 [employer].

## 2016-11-10 ENCOUNTER — Other Ambulatory Visit (INDEPENDENT_AMBULATORY_CARE_PROVIDER_SITE_OTHER): Payer: Self-pay | Admitting: Physician Assistant

## 2016-11-10 ENCOUNTER — Telehealth (INDEPENDENT_AMBULATORY_CARE_PROVIDER_SITE_OTHER): Payer: Self-pay | Admitting: Physician Assistant

## 2016-11-10 DIAGNOSIS — F988 Other specified behavioral and emotional disorders with onset usually occurring in childhood and adolescence: Secondary | ICD-10-CM | POA: Insufficient documentation

## 2016-11-10 MED ORDER — DEXMETHYLPHENIDATE HCL ER 30 MG PO CP24: 1.0000 | ORAL_CAPSULE | Freq: Every day | ORAL | 0 refills | Status: DC

## 2016-11-10 NOTE — Telephone Encounter (Signed)
Patient left voicemail stated needs refill on medication FOCALIN XR) 30 MG   Please call her when  Rx is ready for pick up.

## 2016-11-10 NOTE — Progress Notes (Signed)
Refill request

## 2016-11-10 NOTE — Telephone Encounter (Signed)
Focalin refill request. Is this refill appropriate?

## 2016-11-10 NOTE — Telephone Encounter (Signed)
I will fill this time without appointment but she will have to return on appointment for the next refill. She will need to eventually establish care with psychiatry.

## 2016-11-19 ENCOUNTER — Ambulatory Visit: Payer: Self-pay | Admitting: Family Medicine

## 2016-12-11 ENCOUNTER — Ambulatory Visit (INDEPENDENT_AMBULATORY_CARE_PROVIDER_SITE_OTHER): Payer: 59 | Admitting: Physician Assistant

## 2016-12-11 ENCOUNTER — Encounter (INDEPENDENT_AMBULATORY_CARE_PROVIDER_SITE_OTHER): Payer: Self-pay | Admitting: Physician Assistant

## 2016-12-11 VITALS — BP 131/84 | HR 80 | Temp 98.5°F | Wt 127.6 lb

## 2016-12-11 DIAGNOSIS — F988 Other specified behavioral and emotional disorders with onset usually occurring in childhood and adolescence: Secondary | ICD-10-CM

## 2016-12-11 DIAGNOSIS — Z124 Encounter for screening for malignant neoplasm of cervix: Secondary | ICD-10-CM | POA: Diagnosis not present

## 2016-12-11 DIAGNOSIS — F3181 Bipolar II disorder: Secondary | ICD-10-CM

## 2016-12-11 MED ORDER — ARIPIPRAZOLE 10 MG PO TABS: 10.0000 mg | ORAL_TABLET | Freq: Every day | ORAL | 3 refills | Status: DC

## 2016-12-11 MED ORDER — DEXMETHYLPHENIDATE HCL ER 30 MG PO CP24: 1.0000 | ORAL_CAPSULE | Freq: Every day | ORAL | 0 refills | Status: DC

## 2016-12-11 NOTE — Progress Notes (Signed)
Subjective:  Patient ID: Alice [name], female    DOB: 1976-01-25  Age: 41 y.o. MRN: [id number]  CC: medication refill  HPI Alice [name] is a 41 y.o. female with a PMH of avascular necrosis of the hip, bipolar 2 disorder, and ADD. Had an appointment with psychiatry at [facility] in July but they cancelled her appointment. Has a rescheduled appointment on August 2nd. Denies any symptoms to include chest pain, palpitations, SOB, HA, appetite changes, mood changes, restlessness, f/c/n/v, or abdominal pain.     Outpatient Medications Prior to Visit  Medication Sig Dispense Refill  . ALPRAZolam (XANAX PO) Take by mouth as needed.     . Amphetamine-Dextroamphetamine (ADDERALL XR PO) Take 20 mg by mouth.     . DULoxetine HCl (CYMBALTA PO) Take by mouth.      Marland Kitchen HYDROmorphone HCl (EXALGO PO) Take by mouth.      . LamoTRIgine (LAMICTAL PO) Take 200 mg by mouth.     . norethindrone-ethinyl estradiol (JUNEL FE,GILDESS FE,LOESTRIN FE) 1-20 MG-MCG tablet Take 1 tablet by mouth daily. 1 Package 0  . oxyCODONE-acetaminophen (PERCOCET) 10-325 MG per tablet Take 1 tablet by mouth every 4 (four) hours as needed.      . Tapentadol HCl (NUCYNTA ER) 200 MG TB12 Take by mouth.      . temazepam (RESTORIL) 30 MG capsule Take 30 mg by mouth. 2 tabs qhs     . Dexmethylphenidate HCl (FOCALIN XR) 30 MG CP24 Take 1 capsule (30 mg total) by mouth daily. 30 capsule 0   No facility-administered medications prior to visit.      ROS Review of Systems  Constitutional: Negative for chills, fever and malaise/fatigue.  Eyes: Negative for blurred vision.  Respiratory: Negative for shortness of breath.   Cardiovascular: Negative for chest pain and palpitations.  Gastrointestinal: Negative for abdominal pain and nausea.  Genitourinary: Negative for dysuria and hematuria.  Musculoskeletal: Negative for joint pain and myalgias.  Skin: Negative for rash.  Neurological: Negative for tingling and  headaches.  Psychiatric/Behavioral: Negative for depression. The patient is not nervous/anxious.     Objective:  BP 131/84 (BP Location: Right Arm, Patient Position: Sitting, Cuff Size: Normal)   Pulse 80   Temp 98.5 F (36.9 C) (Oral)   Wt 127 lb 9.6 oz (57.9 kg)   LMP 11/30/2016 (Exact Date)   SpO2 97%   BMI 22.60 kg/m   BP/Weight 12/11/2016 10/12/2016 01/29/2011  Systolic BP 131 124 100  Diastolic BP 84 77 60  Wt. (Lbs) 127.6 127.4 115  BMI 22.6 22.57 20.05      Physical Exam  Constitutional: She is oriented to person, place, and time.  Well developed, well nourished, NAD, polite  HENT:  Head: Normocephalic and atraumatic.  Eyes: No scleral icterus.  Neck: Normal range of motion. Neck supple. No thyromegaly present.  Cardiovascular: Normal rate, regular rhythm and normal heart sounds.   Pulmonary/Chest: Effort normal and breath sounds normal.  Musculoskeletal: She exhibits no edema.  Neurological: She is alert and oriented to person, place, and time. No cranial nerve deficit. Coordination normal.  Skin: Skin is warm and dry. No rash noted. No erythema. No pallor.  Psychiatric: She has a normal mood and affect. Her behavior is normal. Thought content normal.  Vitals reviewed.    Assessment & Plan:   1. Attention deficit disorder, unspecified hyperactivity presence - Refill Dexmethylphenidate HCl (FOCALIN XR) 30 MG CP24; Take 1 capsule (30 mg total) by mouth daily.  Dispense: 30 capsule; Refill: 0  2. Bipolar 2 disorder (HCC) - Refill (not originally on med list) ARIPiprazole (ABILIFY) 10 MG tablet; Take 1 tablet (10 mg total) by mouth daily.  Dispense: 30 tablet; Refill: 3  3. Screening for cervical cancer - Ambulatory referral to Gynecology   Meds ordered this encounter  Medications  . Dexmethylphenidate HCl (FOCALIN XR) 30 MG CP24    Sig: Take 1 capsule (30 mg total) by mouth daily.    Dispense:  30 capsule    Refill:  0    Order Specific Question:    Supervising Provider    Answer:   Quentin AngstJEGEDE, OLUGBEMIGA E [id number]  . ARIPiprazole (ABILIFY) 10 MG tablet    Sig: Take 1 tablet (10 mg total) by mouth daily.    Dispense:  30 tablet    Refill:  3    Order Specific Question:   Supervising Provider    Answer:   Quentin AngstJEGEDE, OLUGBEMIGA E [id number]    Follow-up: Return if symptoms worsen or fail to improve.   Loletta Specteroger David Gomez PA

## 2016-12-11 NOTE — Patient Instructions (Signed)
Stimulant Use Disorder-Amphetamines Amphetamines belong to a group of powerful drugs known as stimulants. Common street names for amphetamines include speed and crank. Amphetamines have a number of medical uses, but they are often misused because of the effects that they produce. These effects include:  A feeling of extreme pleasure (euphoria).  Alertness.  Increased attention.  A high energy level.  Loss of appetite.  Stimulant use disorder is when your stimulant use disrupts your daily life. It may disrupt your relationships and how you do your job. Stimulant use disorder can be dangerous. Amphetamines increase your blood pressure and heart rate. Using them can lead to a heart attack or stroke. Amphetamines can also make your heart rate irregular, cause seizures, and raise your body temperature. These problems can lead to death. What are the causes? This condition is caused by misusing amphetamines for a period of time, such as by taking them for reasons other than to treat a diagnosed problem. Many people start using amphetamines because they make them feel good. Over time, they get addicted to them. When they try to stop using them, they feel sick. What increases the risk? This condition is more likely to develop in people who:  Misuse other drugs.  Have problems with mood or behavior.  What are the signs or symptoms? Symptoms of this condition include:  Using greater amounts of an amphetamine than you want to, or using an amphetamine for longer than you want to.  Trying several times to use less of an amphetamine or to control your amphetamine use.  Craving amphetamines.  Spending a lot of time getting amphetamines, using them, or recovering from their effects.  Having problems at work, at school, at home, or in relationships because of amphetamine use.  Giving up or cutting down on important life activities because of amphetamine use.  Using amphetamines when it is  dangerous, such as when driving a car.  Continuing to use amphetamines even though they are causing or have led to a physical problem, such as: ? Unintended weight loss. ? High blood pressure. ? Chest pain. ? An infection, such as hepatitis or HIV (human immunodeficiency virus).  Continuing to use amphetamines even though they are causing a mental problem, such as: ? Anxiety. ? Sleep problems. ? Schizophrenia-like symptoms. ? Depression. ? Bipolar mood swings. ? Violent behavior.  Needing more and more of an amphetamine to get the same effect that you want (building up a tolerance).  Having symptoms of withdrawal when you stop using an amphetamine. Symptoms of withdrawal include: ? Headache. ? Chills. ? Palpitations or an irregular heart beat. ? Changes in blood pressure. ? Chest pain. ? Dry mouth or changes in taste. ? Cramping in the abdomen. ? Nausea, vomiting, or diarrhea. ? Mood changes. ? Fatigue. ? Sleep changes or bad dreams. ? Increased appetite. ? Restlessness.  How is this diagnosed? This condition is diagnosed with an assessment. During the assessment, your health care provider will ask about your amphetamine use and about how it affects your life. You will be diagnosed with the condition if you have had at least two symptoms of this condition within a 12-month period. How severe the condition is depends on how many symptoms you have:  If you have 2 or 3 symptoms, your condition is mild.  If you have 4 or 5 symptoms, your condition is moderate.  If you have 6 or more symptoms, your condition is severe.  Your health care provider may perform a physical   exam or do lab tests to see if you have physical problems resulting from amphetamine use. Your health care provider may also screen for drug use and refer you to a mental health professional for evaluation. How is this treated? There are two types of treatment for this condition:  Short-term medical treatment.  This type helps you live your life. It helps prevent or minimize damage from any physical or mental problems that are related to your amphetamine use.  Long-term substance abuse treatment. This type helps you stop using amphetamines. It is usually provided by mental health professionals with training in substance use disorders. It usually involves a combination of the following: ? Counseling. This treatment is also called talk therapy. It is provided by substance use treatment counselors. A counselor can address the reasons you use amphetamines and suggest ways to keep you from using amphetamines again. The goals of talk therapy are to find healthy activities and ways to cope with stress, identify and avoid what triggers your amphetamine use, and help you learn how to handle cravings. ? Support groups. Support groups are run by people who have quit using stimulants. They provide emotional support, advice, and guidance. ? Medicine. Medicines may be prescribed to reduce cravings or to block the good feeling that you get from using amphetamines.  Follow these instructions at home:  Take over-the-counter and prescription medicines only as told by your health care provider.  Check with your health care provider before starting any new medicines.  Keep all follow-up visits as told by your health care provider. This is important. Where to find more information:  National Institute on Drug Abuse: www.drugabuse.gov  Substance Abuse and Mental Health Services Administration: www.samhsa.gov Contact a health care provider if:  Your symptoms get worse.  You use amphetamines again.  You are not able to take medicines as told. Get help right away if:  You have serious thoughts about hurting yourself or others.  You have a seizure.  You have chest pain.  You have sudden weakness.  You lose some of your vision.  You lose some of your speech. If you ever feel like you may hurt yourself or  others, or have thoughts about taking your own life, get help right away. You can go to your nearest emergency department or call:  Your local emergency services (911 in the [place]  A suicide crisis helpline, such as the National Suicide Prevention Lifeline at 1-800-273-8255. This is open 24 hours a day.  This information is not intended to replace advice given to you by your health care provider. Make sure you discuss any questions you have with your health care provider. Document Released: 05/26/2001 Document Revised: 03/13/2016 Document Reviewed: 03/13/2016 Elsevier Interactive Patient Education  2018 [employer] Inc[employer]  

## 2016-12-22 ENCOUNTER — Encounter: Payer: Self-pay | Admitting: Advanced Practice Midwife

## 2017-01-06 ENCOUNTER — Telehealth (INDEPENDENT_AMBULATORY_CARE_PROVIDER_SITE_OTHER): Payer: Self-pay | Admitting: Physician Assistant

## 2017-01-06 ENCOUNTER — Other Ambulatory Visit (INDEPENDENT_AMBULATORY_CARE_PROVIDER_SITE_OTHER): Payer: Self-pay | Admitting: Physician Assistant

## 2017-01-06 DIAGNOSIS — F988 Other specified behavioral and emotional disorders with onset usually occurring in childhood and adolescence: Secondary | ICD-10-CM

## 2017-01-06 MED ORDER — DEXMETHYLPHENIDATE HCL ER 30 MG PO CP24: 1.0000 | ORAL_CAPSULE | Freq: Every day | ORAL | 0 refills | Status: DC

## 2017-01-06 NOTE — Telephone Encounter (Signed)
Pt called requesting last refill for medication for Dexmethylphenidate HCl (FOCALIN XR) 30 MG CP24 , states she has an appt 08/02 with psychiatrist

## 2017-01-06 NOTE — Telephone Encounter (Signed)
She can come and pick up rx. I will cover the gap of five days from the last pill to her appointment.

## 2017-01-07 NOTE — Telephone Encounter (Signed)
Patient notified via voicemail that she can pick up rx and that provider is prescribing enough to get patient through until her psych appointment. Maryjean Mornempestt S Roberts, CMA

## 2017-01-14 ENCOUNTER — Encounter (INDEPENDENT_AMBULATORY_CARE_PROVIDER_SITE_OTHER): Payer: Self-pay

## 2017-01-14 ENCOUNTER — Ambulatory Visit (INDEPENDENT_AMBULATORY_CARE_PROVIDER_SITE_OTHER): Payer: 59 | Admitting: Psychiatry

## 2017-01-14 ENCOUNTER — Encounter (HOSPITAL_COMMUNITY): Payer: Self-pay | Admitting: Psychiatry

## 2017-01-14 VITALS — BP 132/78 | HR 86 | Ht 63.25 in | Wt 132.0 lb

## 2017-01-14 DIAGNOSIS — F988 Other specified behavioral and emotional disorders with onset usually occurring in childhood and adolescence: Secondary | ICD-10-CM | POA: Diagnosis not present

## 2017-01-14 DIAGNOSIS — G4710 Hypersomnia, unspecified: Secondary | ICD-10-CM | POA: Diagnosis not present

## 2017-01-14 DIAGNOSIS — F4312 Post-traumatic stress disorder, chronic: Secondary | ICD-10-CM

## 2017-01-14 DIAGNOSIS — M87059 Idiopathic aseptic necrosis of unspecified femur: Secondary | ICD-10-CM | POA: Insufficient documentation

## 2017-01-14 DIAGNOSIS — M25559 Pain in unspecified hip: Secondary | ICD-10-CM

## 2017-01-14 DIAGNOSIS — F3181 Bipolar II disorder: Secondary | ICD-10-CM

## 2017-01-14 DIAGNOSIS — G8929 Other chronic pain: Secondary | ICD-10-CM | POA: Insufficient documentation

## 2017-01-14 DIAGNOSIS — F17210 Nicotine dependence, cigarettes, uncomplicated: Secondary | ICD-10-CM

## 2017-01-14 MED ORDER — DEXMETHYLPHENIDATE HCL ER 30 MG PO CP24: 30.0000 mg | ORAL_CAPSULE | Freq: Every day | ORAL | 0 refills | Status: DC

## 2017-01-14 MED ORDER — DULOXETINE HCL 20 MG PO CPEP: 20.0000 mg | ORAL_CAPSULE | Freq: Every day | ORAL | 0 refills | Status: DC

## 2017-01-14 MED ORDER — MODAFINIL 200 MG PO TABS: 200.0000 mg | ORAL_TABLET | Freq: Every day | ORAL | 2 refills | Status: DC

## 2017-01-14 MED ORDER — ARIPIPRAZOLE 10 MG PO TABS: 10.0000 mg | ORAL_TABLET | Freq: Every day | ORAL | 1 refills | Status: DC

## 2017-01-14 MED ORDER — ARIPIPRAZOLE 2 MG PO TABS: 2.0000 mg | ORAL_TABLET | Freq: Every day | ORAL | 1 refills | Status: DC

## 2017-01-14 MED ORDER — LAMOTRIGINE 200 MG PO TABS: 200.0000 mg | ORAL_TABLET | Freq: Every day | ORAL | 1 refills | Status: DC

## 2017-01-14 MED ORDER — MODAFINIL 100 MG PO TABS: 100.0000 mg | ORAL_TABLET | Freq: Every day | ORAL | 0 refills | Status: DC

## 2017-01-14 NOTE — Progress Notes (Signed)
Psychiatric Initial Adult Assessment   Patient Identification: Alice [name] MRN:  [id number] Date of Evaluation:  01/14/2017 Referral Source: self Chief Complaint:  bipolar, hypersomnia Visit Diagnosis:    ICD-10-CM   1. Chronic post-traumatic stress disorder (PTSD) F43.12 Ambulatory referral to Psychology    Dexmethylphenidate HCl (FOCALIN XR) 30 MG CP24    DULoxetine (CYMBALTA) 20 MG capsule  2. Bipolar II disorder (HCC) F31.81 lamoTRIgine (LAMICTAL) 200 MG tablet    ARIPiprazole (ABILIFY) 2 MG tablet  3. Bipolar 2 disorder (HCC) F31.81 ARIPiprazole (ABILIFY) 10 MG tablet  4. Attention deficit disorder, unspecified hyperactivity presence F98.8 Dexmethylphenidate HCl (FOCALIN XR) 30 MG CP24    Dexmethylphenidate HCl (FOCALIN XR) 30 MG CP24    modafinil (PROVIGIL) 200 MG tablet  5. Hypersomnia G47.10 modafinil (PROVIGIL) 100 MG tablet    History of Present Illness:  Alice [name] presents today for psychiatric intake assessment. She reports that she had seen to NP Poulos at [facility] for about 15 years, but decided to come to this clinic for a new intake assessment because she had moved away to [place] for the last year, and returned to [place], and she went to make a follow-up appointment with [facility], and was told that she would need to put a $150 deposit, which she could not afford.    She reports that she had moved to [place] to be with her boyfriend who she had been dating for a year at the time. She reports that this ended up not working out, and reports that their relationship was often conflictual, no physical violence, but she often felt invalidated and unheard. She reports that she found a new job. [place] [employer] and decided to move back and ended the relationship.  She reports that she is dealing with this breakup without any issue, and reports that she has good friends and family nearby.   She reports that her mother's nearby, and is a support, but also a  source of contention and stress. She reports that mother is a"typical narcissist" and that growing up with mother was very difficult. She reports that her mom was quite controlling, often hovering around the patient reminding her that she to get her homework done. 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   She reports that she has struggled with ADHD over the years, but with medication management she was able to graduate from [employer] in psychology and [place]. 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 She is now dating a new boyfriend in the area and things are going well. She reports that he has also been diagnosed with bipolar 2 disorder, and she shares a reliable history of hypomanic periods lasting for 3-4 days.. She reports that Abilify in combination with Lamictal had been very effective for her.  With regard to her medications, she is currently on Abilify 10 mg, Lamictal 200 mg, and Cymbalta 60 mg. She reports that Cymbalta was added a few years ago after her hip replacement. She had avascular necrosis of unclear etiology, and required hip transplant, and Cymbalta was added to help with some of the pain she had been experiencing. She wonders about tapering off of Cymbalta.  She reports that she has also had a sleep study in 2014, and has  a diagnosis of hypersomnia disorder. She reports that she has wondered about narcolepsy in the past, and she reports that she has had awaking paralysis, hypnagogic hallucinations, and goes into REM and dreams sleep fairly immediately. She reports that she can fall asleep fairly easily, naps in cars during short rides, and has fallen asleep at her desk at work. She reports that she can take a nap pretty much any time during the day, and can get 10-15 hours  of sleep without feeling refreshed.    We spent time discussing Provigil which she had been on in the past. We agreed to start Provigil at 100 mg daily and titrate to 200 mg in 1 month. We also agreed to continue her current dose of Focalin XR 30 mg daily. We agreed to taper her Cymbalta, and increase Abilify to 12 mg if required. She denies any acute safety issues.  Interested in establishing a therapy provider, as she realizes that she has significant issues with self-esteem related to her childhood and validation from her mom. I provided her referral to see Dr. Dewayne HatchMendelson in the psychology clinic.  Associated Signs/Symptoms: Depression Symptoms:  hypersomnia, psychomotor retardation, feelings of worthlessness/guilt, loss of energy/fatigue, (Hypo) Manic Symptoms:  none currently Anxiety Symptoms:  Excessive Worry, Psychotic Symptoms:  none PTSD Symptoms: Chronic history of emotional trauma and invalidation from her mother  Past Psychiatric History: Psychiatric hospitalization at age 41 for depression, and a psychiatric hospitalization approximately 5 years ago where she was diagnosed with bipolar type II. She has a long history of outpatient psychiatric medication management and a history of individual therapy with Dr. Okey Duprerawford.  Previous Psychotropic Medications: Yes   Substance Abuse History in the last 12 months:  No.  Consequences of Substance Abuse: Negative  Past Medical History:  Past Medical History:  Diagnosis Date  . ADHD (attention deficit hyperactivity disorder)   . Avascular necrosis of bone of hip (HCC)    RIGHT GRAFT  . Bipolar II disorder (HCC)   . Depression     Past Surgical History:  Procedure Laterality Date  . APPENDECTOMY  2000  . BREAST SURGERY  06/2009   RIGHT BREAST BIOPSY-NEGATIVE  . HIP ARTHROSCOPY  2008, 2010   AVASCULAR NECROSIS--RIGHT HIP 2010  . KNEE SURGERY  1995   ACL  . SHOULDER SURGERY  [phone],2003.2008    Family Psychiatric History:  She reports that her mother has narcissistic personality, she has a half sibling without any mental health issues  Family History:  Family History  Problem Relation Age of Onset  . Breast cancer Mother [id number]       BRACA NEGATIVE  . Hypertension Father   . Heart disease Father   . Cancer Father        PROSTATE    Social History:   Social History   Social History  . Marital status: Single    Spouse name: N/A  . Number of children: N/A  . Years of education: N/A   Social History Main Topics  . Smoking status: Current Some Day Smoker  . Smokeless tobacco: Never Used  . Alcohol use Yes     Comment: about 3 times a week  . Drug use: No  . Sexual activity: Yes    Partners: Male    Birth control/ protection: Pill   Other Topics Concern  . None   Social History Narrative  . None    Additional Social History: She graduated from [employer], currently in a relationship with a  female partner, works and a Chief Financial Officer position  Allergies:   Allergies  Allergen Reactions  . Methotrexate Derivatives   . Chantix [Varenicline Tartrate] Rash    Metabolic Disorder Labs: Lab Results  Component Value Date   HGBA1C 5.4 11/26/2010   MPG 108 11/26/2010   No results found for: PROLACTIN Lab Results  Component Value Date   CHOL 166 11/26/2010   TRIG 89 11/26/2010   HDL 50 11/26/2010   CHOLHDL 3.3 11/26/2010   VLDL 18 11/26/2010   LDLCALC 98 11/26/2010     Current Medications: Current Outpatient Prescriptions  Medication Sig Dispense Refill  . ARIPiprazole (ABILIFY) 10 MG tablet Take 1 tablet (10 mg total) by mouth daily. 90 tablet 1  . DULoxetine (CYMBALTA) 20 MG capsule Take 1 capsule (20 mg total) by mouth daily. 90 capsule 0  . norethindrone-ethinyl estradiol (JUNEL FE,GILDESS FE,LOESTRIN FE) 1-20 MG-MCG tablet Take 1 tablet by mouth daily. 1 Package 0  . ARIPiprazole (ABILIFY) 2 MG tablet Take 1 tablet (2 mg total) by mouth daily. Take with 10 mg for a total of 12 mg  daily 90 tablet 1  . Dexmethylphenidate HCl (FOCALIN XR) 30 MG CP24 Take 1 capsule (30 mg total) by mouth daily. 30 capsule 0  . [START ON 02/13/2017] Dexmethylphenidate HCl (FOCALIN XR) 30 MG CP24 Take 1 capsule (30 mg total) by mouth daily. 30 capsule 0  . [START ON 03/15/2017] Dexmethylphenidate HCl (FOCALIN XR) 30 MG CP24 Take 1 capsule (30 mg total) by mouth daily. 30 capsule 0  . lamoTRIgine (LAMICTAL) 200 MG tablet Take 1 tablet (200 mg total) by mouth daily. 90 tablet 1  . modafinil (PROVIGIL) 100 MG tablet Take 1 tablet (100 mg total) by mouth daily. 30 tablet 0  . [START ON 01/28/2017] modafinil (PROVIGIL) 200 MG tablet Take 1 tablet (200 mg total) by mouth daily. 30 tablet 2   No current facility-administered medications for this visit.     Neurologic: Headache: Negative Seizure: Negative Paresthesias:Negative  Musculoskeletal: Strength & Muscle Tone: within normal limits Gait & Station: normal Patient leans: N/A  Psychiatric Specialty Exam: Review of Systems  Constitutional: Negative.   HENT: Negative.   Respiratory: Negative.   Cardiovascular: Negative.   Gastrointestinal: Negative.   Musculoskeletal: Negative.   Neurological: Negative.   Psychiatric/Behavioral: Negative.        Fatigue    Blood pressure 132/78, pulse 86, height 5' 3.25" (1.607 m), weight 132 lb (59.9 kg).Body mass index is 23.2 kg/m.  General Appearance: Casual and Well Groomed  Eye Contact:  Good  Speech:  Clear and Coherent  Volume:  Normal  Mood:  Euthymic  Affect:  Appropriate and Congruent  Thought Process:  Coherent and Goal Directed  Orientation:  Full (Time, Place, and Person)  Thought Content:  Logical  Suicidal Thoughts:  No  Homicidal Thoughts:  No  Memory:  Immediate;   Good  Judgement:  Good  Insight:  Good  Psychomotor Activity:  Normal  Concentration:  Attention Span: Good  Recall:  Good  Fund of Knowledge:Good  Language: Good  Akathisia:  NA  Handed:  Right  AIMS (if  indicated):  0  Assets:  Communication Skills Desire for Improvement Financial Resources/Insurance Housing Intimacy Leisure Time Physical Health Resilience Social Support Talents/Skills Transportation Vocational/Educational  ADL's:  Intact  Cognition: WNL  Sleep:  10-12 hours    Treatment Plan Summary: Alice [name] is a 41 year old female with a psychiatric history consistent with bipolar depression type II, hypersomnia,  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 Her mood is fairly stable on the current medication regimen, but she would like to taper off of Cymbalta as she does not feel that this adds anything of value, and it was added in the context of neuropathic pain related to a hip injury and hip replacement, which has resolved. We agreed to a gradual taper off of Cymbalta, and to continue her current doses of Abilify and Lamictal. With regard to her hypersomnia symptoms, she does display a sleep history concerning for narcolepsy, and I have suggested that we repeat her sleep study and make a referral for neurology. She wishes to hold off at this time given the costs and her deductible, but we will revisit this in the future. She is previously been on Provigil in the past with good results, and we agreed to restart at the doses as below. I cautioned her that Provigil would interact with her oral contraceptive and she should use a backup form of contraception, as she should not become pregnant with Lamictal in her system. She is of childbearing age, but does not have any intention of becoming pregnant this time.  No acute safety issues, and we will follow-up in 2 months.  1. Chronic post-traumatic stress disorder (PTSD)   2. Bipolar II disorder (HCC)   3. Bipolar 2 disorder (HCC)   4. Attention deficit disorder, unspecified hyperactivity presence   5.  Hypersomnia    Continue Focalin XR 30 mg daily Continue Abilify 10 mg daily; I have sent an additional 2 mg daily at the patient may take if she feels her mood declines with tapering off of Cymbalta Continue Lamictal 200 mg daily Initiate Provigil 100 mg daily, increase to 200 mg in 2-4 weeks as tolerated Taper Cymbalta to 40 mg 1 week, then 20 mg 2 weeks, then discontinue Return to clinic in 2 months Ambulatory referral to psychology for individual therapy   Burnard Leigh, MD 8/2/20181:33 PM

## 2017-01-21 ENCOUNTER — Encounter: Payer: 59 | Admitting: Advanced Practice Midwife

## 2017-02-22 ENCOUNTER — Ambulatory Visit (INDEPENDENT_AMBULATORY_CARE_PROVIDER_SITE_OTHER): Payer: 59 | Admitting: Psychiatry

## 2017-02-22 DIAGNOSIS — F4312 Post-traumatic stress disorder, chronic: Secondary | ICD-10-CM

## 2017-02-22 DIAGNOSIS — Y9389 Activity, other specified: Secondary | ICD-10-CM | POA: Insufficient documentation

## 2017-02-22 DIAGNOSIS — Z79899 Other long term (current) drug therapy: Secondary | ICD-10-CM | POA: Diagnosis not present

## 2017-02-22 DIAGNOSIS — Y929 Unspecified place or not applicable: Secondary | ICD-10-CM | POA: Diagnosis not present

## 2017-02-22 DIAGNOSIS — W1843XA Slipping, tripping and stumbling without falling due to stepping from one level to another, initial encounter: Secondary | ICD-10-CM | POA: Diagnosis not present

## 2017-02-22 DIAGNOSIS — F3181 Bipolar II disorder: Secondary | ICD-10-CM

## 2017-02-22 DIAGNOSIS — F988 Other specified behavioral and emotional disorders with onset usually occurring in childhood and adolescence: Secondary | ICD-10-CM | POA: Diagnosis not present

## 2017-02-22 DIAGNOSIS — E039 Hypothyroidism, unspecified: Secondary | ICD-10-CM | POA: Insufficient documentation

## 2017-02-22 DIAGNOSIS — S82142A Displaced bicondylar fracture of left tibia, initial encounter for closed fracture: Secondary | ICD-10-CM | POA: Diagnosis not present

## 2017-02-22 DIAGNOSIS — Z96641 Presence of right artificial hip joint: Secondary | ICD-10-CM | POA: Insufficient documentation

## 2017-02-22 DIAGNOSIS — S8992XA Unspecified injury of left lower leg, initial encounter: Secondary | ICD-10-CM | POA: Diagnosis present

## 2017-02-22 DIAGNOSIS — G4710 Hypersomnia, unspecified: Secondary | ICD-10-CM

## 2017-02-22 DIAGNOSIS — F17200 Nicotine dependence, unspecified, uncomplicated: Secondary | ICD-10-CM | POA: Diagnosis not present

## 2017-02-22 DIAGNOSIS — Y999 Unspecified external cause status: Secondary | ICD-10-CM | POA: Diagnosis not present

## 2017-02-22 DIAGNOSIS — F17210 Nicotine dependence, cigarettes, uncomplicated: Secondary | ICD-10-CM | POA: Diagnosis not present

## 2017-02-22 MED ORDER — DEXMETHYLPHENIDATE HCL ER 30 MG PO CP24: 30.0000 mg | ORAL_CAPSULE | Freq: Every day | ORAL | 0 refills | Status: DC

## 2017-02-22 MED ORDER — MODAFINIL 200 MG PO TABS: 200.0000 mg | ORAL_TABLET | Freq: Every day | ORAL | 2 refills | Status: DC

## 2017-02-22 NOTE — Progress Notes (Signed)
BH MD/PA/NP OP Progress Note  02/22/2017 4:19 PM Alice [name]  MRN:  [id number]  Chief Complaint: med check HPI: Alice [name] reports that the Provigil has been well-tolerated, and in conjunction with the Consuello MasseFocalin is working well. She reports that she is now just taking Lamictal and Abilify. She has remained at 10 mg of Abilify and her mood has been stable. She had some brain zaps sensations and she decreased the Cymbalta, but this was tolerable. We agreed to increase Provigil to 200 mg for maintenance dose of hypersomnia. We spent time once again reviewing the potential need for a repeat sleep study in the future if we like to try her on Xyrem. She agrees to hold off on now to see if she has ongoing and continued clinical benefit with Provigil. She has a individual therapy intake coming up in about 1 month, and we'll follow-up with this writer in 10 weeks.  Visit Diagnosis:    ICD-10-CM   1. Hypersomnia G47.10   2. Attention deficit disorder, unspecified hyperactivity presence F98.8 modafinil (PROVIGIL) 200 MG tablet    Dexmethylphenidate HCl (FOCALIN XR) 30 MG CP24  3. Chronic post-traumatic stress disorder (PTSD) F43.12   4. Bipolar 2 disorder (HCC) F31.81     Past Psychiatric History: See intake H&P for full details. Reviewed, with no updates at this time.   Past Medical History:  Past Medical History:  Diagnosis Date  . ADHD (attention deficit hyperactivity disorder)   . Avascular necrosis of bone of hip (HCC)    RIGHT GRAFT  . Bipolar II disorder (HCC)   . Depression     Past Surgical History:  Procedure Laterality Date  . APPENDECTOMY  2000  . BREAST SURGERY  06/2009   RIGHT BREAST BIOPSY-NEGATIVE  . HIP ARTHROSCOPY  2008, 2010   AVASCULAR NECROSIS--RIGHT HIP 2010  . KNEE SURGERY  1995   ACL  . SHOULDER SURGERY  [phone],2003.2008    Family Psychiatric History: See intake H&P for full details. Reviewed, with no updates at this time.   Family History:  Family  History  Problem Relation Age of Onset  . Breast cancer Mother [id number]       BRACA NEGATIVE  . Hypertension Father   . Heart disease Father   . Cancer Father        PROSTATE    Social History:  Social History   Social History  . Marital status: Single    Spouse name: N/A  . Number of children: N/A  . Years of education: N/A   Social History Main Topics  . Smoking status: Current Some Day Smoker  . Smokeless tobacco: Never Used  . Alcohol use Yes     Comment: about 3 times a week  . Drug use: No  . Sexual activity: Yes    Partners: Male    Birth control/ protection: Pill   Other Topics Concern  . Not on file   Social History Narrative  . No narrative on file    Allergies:  Allergies  Allergen Reactions  . Methotrexate Derivatives   . Chantix [Varenicline Tartrate] Rash    Metabolic Disorder Labs: Lab Results  Component Value Date   HGBA1C 5.4 11/26/2010   MPG 108 11/26/2010   No results found for: PROLACTIN Lab Results  Component Value Date   CHOL 166 11/26/2010   TRIG 89 11/26/2010   HDL 50 11/26/2010   CHOLHDL 3.3 11/26/2010   VLDL 18 11/26/2010   LDLCALC 98 11/26/2010  Lab Results  Component Value Date   TSH 1.135 11/26/2010    Therapeutic Level Labs: No results found for: LITHIUM No results found for: VALPROATE No components found for:  CBMZ  Current Medications: Current Outpatient Prescriptions  Medication Sig Dispense Refill  . ARIPiprazole (ABILIFY) 10 MG tablet Take 1 tablet (10 mg total) by mouth daily. 90 tablet 1  . Dexmethylphenidate HCl (FOCALIN XR) 30 MG CP24 Take 1 capsule (30 mg total) by mouth daily. 30 capsule 0  . Dexmethylphenidate HCl (FOCALIN XR) 30 MG CP24 Take 1 capsule (30 mg total) by mouth daily. 30 capsule 0  . [START ON 03/15/2017] Dexmethylphenidate HCl (FOCALIN XR) 30 MG CP24 Take 1 capsule (30 mg total) by mouth daily. 30 capsule 0  . Dexmethylphenidate HCl (FOCALIN XR) 30 MG CP24 Take 1 capsule (30 mg total) by  mouth daily. 30 capsule 0  . lamoTRIgine (LAMICTAL) 200 MG tablet Take 1 tablet (200 mg total) by mouth daily. 90 tablet 1  . modafinil (PROVIGIL) 200 MG tablet Take 1 tablet (200 mg total) by mouth daily. 30 tablet 2  . norethindrone-ethinyl estradiol (JUNEL FE,GILDESS FE,LOESTRIN FE) 1-20 MG-MCG tablet Take 1 tablet by mouth daily. 1 Package 0   No current facility-administered medications for this visit.      Musculoskeletal: Strength & Muscle Tone: within normal limits Gait & Station: normal Patient leans: N/A  Psychiatric Specialty Exam: ROS  There were no vitals taken for this visit.There is no height or weight on file to calculate BMI.  General Appearance: Casual and Well Groomed  Eye Contact:  Good  Speech:  Clear and Coherent  Volume:  Normal  Mood:  Euthymic  Affect:  Appropriate and Congruent  Thought Process:  Goal Directed  Orientation:  Full (Time, Place, and Person)  Thought Content: Logical   Suicidal Thoughts:  No  Homicidal Thoughts:  No  Memory:  Immediate;   Good  Judgement:  Good  Insight:  Good  Psychomotor Activity:  Normal  Concentration:  Concentration: Good  Recall:  Good  Fund of Knowledge: Good  Language: Good  Akathisia:  Negative  Handed:  Right  AIMS (if indicated): not done  Assets:  Communication Skills Desire for Improvement Financial Resources/Insurance Housing Intimacy Leisure Time Physical Health Resilience Social Support Talents/Skills Transportation Vocational/Educational  ADL's:  Intact  Cognition: WNL  Sleep:  Good   Screenings: GAD-7     Office Visit from 10/12/2016 in [facility]  Total GAD-7 Score  6    PHQ2-9     Office Visit from 12/11/2016 in [facility] Office Visit from 10/12/2016 in [facility]  PHQ-2 Total Score  0  0       Assessment and Plan: Alice [name] is a 41 year old female, with a history of bipolar type II, narcolepsy  spectrum or hypersomnia disorder, and ADHD. She has a history of significant emotional trauma in childhood, and a tenuous relationship with her mother. She would benefit from ongoing participation in individual therapy, and has an intake scheduled for next month. With regard to her medications, we are titrating Provigil for improved wakefulness. Her history is quite consistent with idiopathic hypersomnia. No acute safety issues at this time and we will follow up in 10 weeks or sooner if needed.  1. Hypersomnia   2. Attention deficit disorder, unspecified hyperactivity presence   3. Chronic post-traumatic stress disorder (PTSD)   4. Bipolar 2 disorder (HCC)  Increase Provigil to 200 mg daily Continue Abilify 10 mg and Lamictal 200 mg for mood stabilizer Follow-up with individual therapy Return to clinic in 10 weeks Cymbalta discontinued this week after completion of taper Consider repeat sleep study if we plateau in terms of benefits from Provigil and need to consider Xyrem  Burnard Leigh, MD 02/22/2017, 4:19 PM

## 2017-02-23 ENCOUNTER — Emergency Department (HOSPITAL_COMMUNITY): Payer: 59

## 2017-02-23 ENCOUNTER — Emergency Department (HOSPITAL_COMMUNITY)
Admission: EM | Admit: 2017-02-23 | Discharge: 2017-02-23 | Disposition: A | Discharge status: Home / Self Care | Payer: 59 | Attending: Emergency Medicine | Admitting: Emergency Medicine

## 2017-02-23 ENCOUNTER — Encounter (HOSPITAL_COMMUNITY): Payer: Self-pay | Admitting: Emergency Medicine

## 2017-02-23 DIAGNOSIS — S82142A Displaced bicondylar fracture of left tibia, initial encounter for closed fracture: Secondary | ICD-10-CM

## 2017-02-23 MED ORDER — IBUPROFEN 800 MG PO TABS
800.0000 mg | ORAL_TABLET | Freq: Once | ORAL | Status: AC
  Administered 2017-02-23: 800 mg via ORAL
  Filled 2017-02-23: qty 1

## 2017-02-23 MED ORDER — IBUPROFEN 600 MG PO TABS: 600.0000 mg | ORAL_TABLET | Freq: Four times a day (QID) | ORAL | 0 refills | Status: DC | PRN

## 2017-02-23 MED ORDER — OXYCODONE-ACETAMINOPHEN 5-325 MG PO TABS
2.0000 | ORAL_TABLET | Freq: Once | ORAL | Status: AC
  Administered 2017-02-23: 2 via ORAL
  Filled 2017-02-23: qty 2

## 2017-02-23 MED ORDER — OXYCODONE-ACETAMINOPHEN 5-325 MG PO TABS: 1.0000 | ORAL_TABLET | Freq: Four times a day (QID) | ORAL | 0 refills | Status: DC | PRN

## 2017-02-23 NOTE — ED Provider Notes (Signed)
[facility]-EMERGENCY DEPT Provider Note   CSN: [id number] Arrival date & time: 02/22/17  2204    History   Chief Complaint Chief Complaint  Patient presents with  . Knee Injury    HPI Alice CARNERO is a 41 y.o. female.  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 She reports that she missed a step landing awkwardly on her left foot. She reports noting a "pop" in her left knee followed by sudden onset of pain. She has been unable to weight-bear since the incident. Symptoms associated with significant swelling over the left knee. Patient reports a history of anterior cruciate ligament tear on the left as well as prior left tibial plateau fracture. She was followed by orthopedics in [place] at the time. She denies chronic steroid use. No documented history of cancer. She has not had any numbness or paresthesias. Pain in the left knee has been constant, worse with movement. No medications taken prior to arrival.   The history is provided by the patient. No language interpreter was used.    Past Medical History:  Diagnosis Date  . ADHD (attention deficit hyperactivity disorder)   . Avascular necrosis of bone of hip (HCC)    RIGHT GRAFT  . Bipolar II disorder (HCC)   . Depression     Patient Active Problem List   Diagnosis Date Noted  . Avascular necrosis of bone of hip (HCC) 01/14/2017  . Chronic hip pain 01/14/2017  . ADD (attention deficit disorder) 11/10/2016  . 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 Personal history of hyperthyroidism 07/16/2011  . Knee pain 05/06/2011    Past Surgical History:  Procedure Laterality Date  . APPENDECTOMY  2000  . BREAST SURGERY  06/2009   RIGHT BREAST BIOPSY-NEGATIVE  . HIP ARTHROSCOPY  2008, 2010   AVASCULAR NECROSIS--RIGHT HIP 2010  . KNEE SURGERY  1995   ACL    . SHOULDER SURGERY  1996,2003.2008    OB History    Gravida Para Term Preterm AB Living   0             SAB TAB Ectopic Multiple Live Births                   Home Medications    Prior to Admission medications   Medication Sig Start Date End Date Taking? Authorizing Provider  ARIPiprazole (ABILIFY) 10 MG tablet Take 1 tablet (10 mg total) by mouth daily. 01/14/17   Eksir, Bo Mcclintock, MD  Dexmethylphenidate HCl (FOCALIN XR) 30 MG CP24 Take 1 capsule (30 mg total) by mouth daily. 01/14/17   Eksir, Bo Mcclintock, MD  Dexmethylphenidate HCl (FOCALIN XR) 30 MG CP24 Take 1 capsule (30 mg total) by mouth daily. 02/13/17   Eksir, Bo Mcclintock, MD  Dexmethylphenidate HCl (FOCALIN XR) 30 MG CP24 Take 1 capsule (30 mg total) by mouth daily. 03/15/17   Eksir, Bo Mcclintock, MD  Dexmethylphenidate HCl (FOCALIN XR) 30 MG CP24 Take 1 capsule (30 mg total) by mouth daily. 02/22/17   Eksir, Bo Mcclintock, MD  lamoTRIgine (LAMICTAL) 200 MG tablet Take 1 tablet (200 mg total) by mouth daily. 01/14/17   Burnard Leigh, MD  modafinil (PROVIGIL) 200 MG tablet Take 1 tablet (200 mg total) by mouth daily. 02/22/17   Eksir, Bo Mcclintock, MD  norethindrone-ethinyl estradiol (JUNEL FE,GILDESS FE,LOESTRIN FE)  1-20 MG-MCG tablet Take 1 tablet by mouth daily. 02/04/12 01/14/17  Harrington Challenger, NP    Family History Family History  Problem Relation Age of Onset  . Breast cancer Mother 75       BRACA NEGATIVE  . Hypertension Father   . Heart disease Father   . Cancer Father        PROSTATE    Social History Social History  Substance Use Topics  . Smoking status: Current Some Day Smoker  . Smokeless tobacco: Never Used  . Alcohol use Yes     Comment: about 3 times a week     Allergies   Methotrexate derivatives and Chantix [varenicline tartrate]   Review of Systems Review of Systems Ten systems reviewed and are negative for acute change, except as noted in the HPI.    Physical  Exam Updated Vital Signs BP 116/73 (BP Location: Left Arm)   Pulse 60   Temp 98.3 F (36.8 C) (Oral)   Resp 18   Ht 5' 3.25" (1.607 m)   Wt 59 kg (130 lb)   LMP 02/21/2017   SpO2 99%   BMI 22.85 kg/m   Physical Exam  Constitutional: She is oriented to person, place, and time. She appears well-developed and well-nourished. No distress.  Nontoxic appearing and in NAD  HENT:  Head: Normocephalic and atraumatic.  Eyes: Conjunctivae and EOM are normal. No scleral icterus.  Neck: Normal range of motion.  Cardiovascular: Normal rate, regular rhythm and intact distal pulses.   DP pulse 2+ in the LLE  Pulmonary/Chest: Effort normal. No respiratory distress.  Respirations even and unlabored  Musculoskeletal:  Very limited ROM of the left knee 2/2 pain with significant swelling over the patella. No appreciable deformity to the LLE.  Neurological: She is alert and oriented to person, place, and time. She exhibits normal muscle tone. Coordination normal.  Skin: Skin is warm and dry. No rash noted. She is not diaphoretic. No erythema. No pallor.  Psychiatric: She has a normal mood and affect. Her behavior is normal.  Nursing note and vitals reviewed.    ED Treatments / Results  Labs (all labs ordered are listed, but only abnormal results are displayed) Labs Reviewed - No data to display  EKG  EKG Interpretation None       Radiology Ct Knee Left Wo Contrast  Result Date: 02/23/2017 CLINICAL DATA:  Missed a step and hit her left knee, abn x-ray, hx of ACL repair to same knee 1995 EXAM: CT OF THE left KNEE WITHOUT CONTRAST TECHNIQUE: Multidetector CT imaging of the left knee was performed according to the standard protocol. Multiplanar CT image reconstructions were also generated. COMPARISON:  Left knee radiographs 02/23/2017 FINDINGS: Bones/Joint/Cartilage Comminuted fractures of the proximal left tibial metaphysis with fracture lines extending to the medial and lateral tibial  plateau. There is impaction and displacement particularly of the lateral tibial plateau. Displacement of the posterior lateral tibial plateau fragment. Fracture lines also extend to the base of the tibial spines. Fracture lines extend to the proximal tibia fibular joint. The patella, distal femur, and fibula appear intact. Ligaments Suboptimally assessed by CT. Muscles and Tendons No discrete muscular abnormality is identified on limited imaging. Soft tissues Left knee hemarthrosis with fat fluid level in the suprapatellar bursa. IMPRESSION: Comminuted fractures of the left proximal tibial metaphysis with extension to the medial and lateral tibial plateau. Impaction and displacement of the lateral tibial plateau. Displacement of the posterolateral tibial plateau fragment. Associated hemarthrosis.  Electronically Signed   By: Burman NievesWilliam  Stevens M.D.   On: 02/23/2017 03:12   Dg Knee Complete 4 Views Left  Result Date: 02/23/2017 CLINICAL DATA:  Larey SeatFell on steps. Unable to bear weight. Anterior knee pain below the patella. EXAM: LEFT KNEE - COMPLETE 4+ VIEW COMPARISON:  None. FINDINGS: Comminuted fractures of the left proximal tibial metaphysis extending to the lateral tibial plateau and to the medial tibial plateau. Mild depression of the lateral tibial plateau. Mild displacement of the posterior tibial plateau fragment. Associated hemarthrosis. Distal femur and patella appear intact. IMPRESSION: Comminuted fractures of the left proximal tibial metaphysis extending to the medial and lateral tibial plateau and with displacement of the posterior tibial plateau fragment. Associated hemarthrosis. Electronically Signed   By: Burman NievesWilliam  Stevens M.D.   On: 02/23/2017 01:20    Procedures Procedures (including critical care time)  Medications Ordered in ED Medications  oxyCODONE-acetaminophen (PERCOCET/ROXICET) 5-325 MG per tablet 2 tablet (2 tablets Oral Given 02/23/17 0257)  ibuprofen (ADVIL,MOTRIN) tablet 800 mg (800 mg  Oral Given 02/23/17 0257)    3:30 AM Case discussed with Dr. Linna CapriceSwinteck who has reviewed the CT imaging. He advises web roll ACE wrap with knee immobilizer and nonweightbearing. He has instructed that patient strictly follow up with Dr. Carola FrostHandy in one week. Patient made aware of, and is comfortable with, plan.   Initial Impression / Assessment and Plan / ED Course  I have reviewed the triage vital signs and the nursing notes.  Pertinent labs & imaging results that were available during my care of the patient were reviewed by me and considered in my medical decision making (see chart for details).     41 year old female presents to the emergency department for left knee pain. She is neurovascularly intact on exam. Significant amount of swelling noted to the left knee. This corresponds with a complex comminuted fracture of the left tibia. No concern for compartment syndrome at this time. The patient has declined IV and IM pain medication. Her pain has been adequately managed with ibuprofen and 2 Percocet tablets. Knee stabilized with a web roll Ace wrap and knee immobilizer. Patient given crutches and instructed to remain strictly nonweightbearing. She is comfortable with outpatient follow-up with Dr. Carola FrostHandy within the next week. Return precautions discussed and provided. Patient discharged in stable condition with no unaddressed concerns.   Final Clinical Impressions(s) / ED Diagnoses   Final diagnoses:  Tibial plateau fracture, left, closed, initial encounter    New Prescriptions New Prescriptions   No medications on file     Antony MaduraHumes, Joan Avetisyan, PA-C 02/23/17 [id number]    Gilda CreasePollina, Christopher J, MD 02/23/17 [phone]

## 2017-02-23 NOTE — ED Triage Notes (Signed)
Patient is complaining of missing a step and hitting her left knee. Patient states she has had injury to that knee before. Patient states she heard something pop.

## 2017-02-23 NOTE — Discharge Instructions (Signed)
Do not put any weight on your left leg. Use crutches to assist with this. Wear a knee immobilizer at all times. Do not remove it unless otherwise instructed to do so by an orthopedic surgeon. Call the office of Dr. Carola FrostHandy this morning to schedule follow-up within 1 week. Take ibuprofen as prescribed for pain. You may supplement this with Percocet as needed. Keep your leg elevated as much as possible. Should you develop significant worsening of your pain, inability to feel your toes, coolness to your foot or toes, or tingling in your extremity, probably return to the emergency department for evaluation. You may also return for other new or concerning symptoms.

## 2017-02-23 NOTE — ED Notes (Signed)
Bed: [facility] Expected date:  Expected time:  Means of arrival:  Comments: 

## 2017-03-03 ENCOUNTER — Encounter (HOSPITAL_COMMUNITY): Payer: Self-pay | Admitting: *Deleted

## 2017-03-03 NOTE — Progress Notes (Signed)
Pt denies SOB, chest pain, and being under the care of a cardiologist. Pt denies having an echo and cardiac cath. Pt stated that a stress test was performed > 10 years ago. Pt denies having an EKG and chest x ray within the last year. Pt denies recent labs. Pt made aware to stop taking Aspirin, vitamins, fish oil and herbal medications. Do not take any NSAIDs ie: Ibuprofen, Advil, Naproxen (Aleve), Motrin, BC and Goody Powder or any medication containing Aspirin. Pt made aware to not take Lovenox injection tonight (per MD/Gwen). Pt verbalized understanding of all pre-op instructions.

## 2017-03-03 NOTE — Anesthesia Preprocedure Evaluation (Addendum)
Anesthesia Evaluation  Patient identified by MRN, date of birth, ID band Patient awake    Reviewed: Allergy & Precautions, NPO status , Patient's Chart, lab work & pertinent test results  History of Anesthesia Complications (+) PONV and history of anesthetic complications  Airway Mallampati: II  TM Distance: >3 FB Neck ROM: Full    Dental no notable dental hx. (+) Teeth Intact   Pulmonary Current Smoker,    Pulmonary exam normal breath sounds clear to auscultation       Cardiovascular negative cardio ROS Normal cardiovascular exam Rhythm:Regular Rate:Normal     Neuro/Psych PSYCHIATRIC DISORDERS Depression Bipolar Disorder ADHD negative neurological ROS     GI/Hepatic negative GI ROS, Neg liver ROS,   Endo/Other  negative endocrine ROS  Renal/GU negative Renal ROS  negative genitourinary   Musculoskeletal Left bicondylar tibial plateau Fx Possible compartment syndrome   Abdominal   Peds  Hematology negative hematology ROS (+)   Anesthesia Other Findings   Reproductive/Obstetrics                            Anesthesia Physical Anesthesia Plan  ASA: III  Anesthesia Plan: General   Post-op Pain Management:    Induction: Intravenous  PONV Risk Score and Plan: 4 or greater and Ondansetron, Dexamethasone, Midazolam, Scopolamine patch - Pre-op and Propofol infusion  Airway Management Planned: Oral ETT  Additional Equipment:   Intra-op Plan:   Post-operative Plan: Extubation in OR  Informed Consent: I have reviewed the patients History and Physical, chart, labs and discussed the procedure including the risks, benefits and alternatives for the proposed anesthesia with the patient or authorized representative who has indicated his/her understanding and acceptance.   Dental advisory given  Plan Discussed with: Anesthesiologist, CRNA and Surgeon  Anesthesia Plan Comments:         Anesthesia Quick Evaluation

## 2017-03-04 ENCOUNTER — Encounter (HOSPITAL_COMMUNITY)
Admission: RE | Disposition: A | Discharge status: Home Health | Payer: Self-pay | Source: Ambulatory Visit | Attending: Orthopedic Surgery

## 2017-03-04 ENCOUNTER — Inpatient Hospital Stay (HOSPITAL_COMMUNITY): Payer: 59 | Admitting: Certified Registered"

## 2017-03-04 ENCOUNTER — Encounter (HOSPITAL_COMMUNITY): Payer: Self-pay | Admitting: *Deleted

## 2017-03-04 ENCOUNTER — Inpatient Hospital Stay (HOSPITAL_COMMUNITY)
Admission: RE | Admit: 2017-03-04 | Discharge: 2017-03-06 | DRG: 488 | Disposition: A | Discharge status: Home Health | Payer: 59 | Source: Ambulatory Visit | Attending: Orthopedic Surgery | Admitting: Orthopedic Surgery

## 2017-03-04 ENCOUNTER — Inpatient Hospital Stay (HOSPITAL_COMMUNITY): Payer: 59

## 2017-03-04 DIAGNOSIS — T148XXA Other injury of unspecified body region, initial encounter: Secondary | ICD-10-CM

## 2017-03-04 DIAGNOSIS — E8889 Other specified metabolic disorders: Secondary | ICD-10-CM | POA: Diagnosis present

## 2017-03-04 DIAGNOSIS — S82142A Displaced bicondylar fracture of left tibia, initial encounter for closed fracture: Principal | ICD-10-CM | POA: Diagnosis present

## 2017-03-04 DIAGNOSIS — F3181 Bipolar II disorder: Secondary | ICD-10-CM | POA: Diagnosis present

## 2017-03-04 DIAGNOSIS — S83289A Other tear of lateral meniscus, current injury, unspecified knee, initial encounter: Secondary | ICD-10-CM | POA: Diagnosis present

## 2017-03-04 DIAGNOSIS — E559 Vitamin D deficiency, unspecified: Secondary | ICD-10-CM | POA: Diagnosis present

## 2017-03-04 DIAGNOSIS — F17200 Nicotine dependence, unspecified, uncomplicated: Secondary | ICD-10-CM | POA: Diagnosis present

## 2017-03-04 DIAGNOSIS — Z419 Encounter for procedure for purposes other than remedying health state, unspecified: Secondary | ICD-10-CM

## 2017-03-04 DIAGNOSIS — F909 Attention-deficit hyperactivity disorder, unspecified type: Secondary | ICD-10-CM | POA: Diagnosis present

## 2017-03-04 DIAGNOSIS — F17210 Nicotine dependence, cigarettes, uncomplicated: Secondary | ICD-10-CM | POA: Diagnosis present

## 2017-03-04 DIAGNOSIS — D62 Acute posthemorrhagic anemia: Secondary | ICD-10-CM | POA: Diagnosis not present

## 2017-03-04 DIAGNOSIS — F988 Other specified behavioral and emotional disorders with onset usually occurring in childhood and adolescence: Secondary | ICD-10-CM | POA: Diagnosis present

## 2017-03-04 DIAGNOSIS — W109XXA Fall (on) (from) unspecified stairs and steps, initial encounter: Secondary | ICD-10-CM | POA: Diagnosis present

## 2017-03-04 DIAGNOSIS — Z79899 Other long term (current) drug therapy: Secondary | ICD-10-CM

## 2017-03-04 DIAGNOSIS — Z888 Allergy status to other drugs, medicaments and biological substances status: Secondary | ICD-10-CM

## 2017-03-04 HISTORY — DX: Nicotine dependence, unspecified, uncomplicated: F17.200

## 2017-03-04 HISTORY — DX: Nausea with vomiting, unspecified: R11.2

## 2017-03-04 HISTORY — PX: FASCIOTOMY: SHX132

## 2017-03-04 HISTORY — PX: ORIF TIBIA PLATEAU: SHX2132

## 2017-03-04 HISTORY — DX: Presence of spectacles and contact lenses: Z97.3

## 2017-03-04 HISTORY — DX: Other specified postprocedural states: Z98.890

## 2017-03-04 LAB — CBC WITH DIFFERENTIAL/PLATELET
BASOS ABS: 0.1 10*3/uL (ref 0.0–0.1)
BASOS PCT: 1 %
EOS ABS: 0.3 10*3/uL (ref 0.0–0.7)
EOS PCT: 4 %
HCT: 36 % (ref 36.0–46.0)
HEMOGLOBIN: 11.7 g/dL — AB (ref 12.0–15.0)
Lymphocytes Relative: 28 %
Lymphs Abs: 2.1 10*3/uL (ref 0.7–4.0)
MCH: 29.5 pg (ref 26.0–34.0)
MCHC: 32.5 g/dL (ref 30.0–36.0)
MCV: 90.7 fL (ref 78.0–100.0)
Monocytes Absolute: 0.5 10*3/uL (ref 0.1–1.0)
Monocytes Relative: 7 %
NEUTROS PCT: 60 %
Neutro Abs: 4.7 10*3/uL (ref 1.7–7.7)
PLATELETS: 258 10*3/uL (ref 150–400)
RBC: 3.97 MIL/uL (ref 3.87–5.11)
RDW: 12.6 % (ref 11.5–15.5)
WBC: 7.7 10*3/uL (ref 4.0–10.5)

## 2017-03-04 LAB — URINALYSIS, ROUTINE W REFLEX MICROSCOPIC
Bilirubin Urine: NEGATIVE
Glucose, UA: NEGATIVE mg/dL
KETONES UR: NEGATIVE mg/dL
Nitrite: NEGATIVE
PROTEIN: 30 mg/dL — AB
Specific Gravity, Urine: 1.031 — ABNORMAL HIGH (ref 1.005–1.030)
pH: 5 (ref 5.0–8.0)

## 2017-03-04 LAB — PREALBUMIN: Prealbumin: 27.7 mg/dL (ref 18–38)

## 2017-03-04 LAB — COMPREHENSIVE METABOLIC PANEL
ALBUMIN: 3.8 g/dL (ref 3.5–5.0)
ALT: 16 U/L (ref 14–54)
AST: 17 U/L (ref 15–41)
Alkaline Phosphatase: 77 U/L (ref 38–126)
Anion gap: 9 (ref 5–15)
BUN: 14 mg/dL (ref 6–20)
CHLORIDE: 105 mmol/L (ref 101–111)
CO2: 22 mmol/L (ref 22–32)
CREATININE: 0.66 mg/dL (ref 0.44–1.00)
Calcium: 9 mg/dL (ref 8.9–10.3)
GFR calc non Af Amer: >60 mL/min (ref >60)
GLUCOSE: 95 mg/dL (ref 65–99)
Potassium: 4 mmol/L (ref 3.5–5.1)
SODIUM: 136 mmol/L (ref 135–145)
Total Bilirubin: 0.2 mg/dL — ABNORMAL LOW (ref 0.3–1.2)
Total Protein: 6.9 g/dL (ref 6.5–8.1)

## 2017-03-04 LAB — CBC
HEMATOCRIT: 34.2 % — AB (ref 36.0–46.0)
HEMOGLOBIN: 11.1 g/dL — AB (ref 12.0–15.0)
MCH: 29.2 pg (ref 26.0–34.0)
MCHC: 32.5 g/dL (ref 30.0–36.0)
MCV: 90 fL (ref 78.0–100.0)
Platelets: 296 10*3/uL (ref 150–400)
RBC: 3.8 MIL/uL — AB (ref 3.87–5.11)
RDW: 12.5 % (ref 11.5–15.5)
WBC: 11.6 10*3/uL — ABNORMAL HIGH (ref 4.0–10.5)

## 2017-03-04 LAB — RAPID URINE DRUG SCREEN, HOSP PERFORMED
Amphetamines: POSITIVE — AB
BARBITURATES: NOT DETECTED
BENZODIAZEPINES: NOT DETECTED
COCAINE: NOT DETECTED
Opiates: NOT DETECTED
TETRAHYDROCANNABINOL: NOT DETECTED

## 2017-03-04 LAB — TYPE AND SCREEN
ABO/RH(D): A NEG
Antibody Screen: NEGATIVE

## 2017-03-04 LAB — PHOSPHORUS: PHOSPHORUS: 2.9 mg/dL (ref 2.5–4.6)

## 2017-03-04 LAB — TSH: TSH: 2.039 u[IU]/mL (ref 0.350–4.500)

## 2017-03-04 LAB — CREATININE, SERUM
Creatinine, Ser: 0.69 mg/dL (ref 0.44–1.00)
GFR calc Af Amer: >60 mL/min (ref >60)
GFR calc non Af Amer: >60 mL/min (ref >60)

## 2017-03-04 LAB — HEMOGLOBIN A1C
HEMOGLOBIN A1C: 5 % (ref 4.8–5.6)
MEAN PLASMA GLUCOSE: 96.8 mg/dL

## 2017-03-04 LAB — HCG, SERUM, QUALITATIVE: PREG SERUM: NEGATIVE

## 2017-03-04 LAB — MAGNESIUM: Magnesium: 2 mg/dL (ref 1.7–2.4)

## 2017-03-04 LAB — C-REACTIVE PROTEIN: CRP: 3 mg/dL — ABNORMAL HIGH (ref <1.0)

## 2017-03-04 LAB — ABO/RH: ABO/RH(D): A NEG

## 2017-03-04 LAB — SEDIMENTATION RATE: SED RATE: 27 mm/h — AB (ref 0–22)

## 2017-03-04 LAB — PROTIME-INR
INR: 1.01
PROTHROMBIN TIME: 13.2 s (ref 11.4–15.2)

## 2017-03-04 LAB — APTT: aPTT: 34 seconds (ref 24–36)

## 2017-03-04 SURGERY — OPEN REDUCTION INTERNAL FIXATION (ORIF) TIBIAL PLATEAU
Anesthesia: General | Site: Leg Lower | Laterality: Left

## 2017-03-04 MED ORDER — DEXAMETHASONE SODIUM PHOSPHATE 10 MG/ML IJ SOLN
INTRAMUSCULAR | Status: AC
  Filled 2017-03-04: qty 1

## 2017-03-04 MED ORDER — DIPHENHYDRAMINE HCL 50 MG/ML IJ SOLN
INTRAMUSCULAR | Status: DC | PRN
  Administered 2017-03-04: 25 mg via INTRAVENOUS

## 2017-03-04 MED ORDER — PROPOFOL 10 MG/ML IV BOLUS
INTRAVENOUS | Status: DC | PRN
  Administered 2017-03-04: 160 mg via INTRAVENOUS

## 2017-03-04 MED ORDER — DEXAMETHASONE SODIUM PHOSPHATE 10 MG/ML IJ SOLN
INTRAMUSCULAR | Status: DC | PRN
  Administered 2017-03-04: 10 mg via INTRAVENOUS

## 2017-03-04 MED ORDER — DIPHENHYDRAMINE HCL 50 MG/ML IJ SOLN
INTRAMUSCULAR | Status: AC
  Filled 2017-03-04: qty 1

## 2017-03-04 MED ORDER — ONDANSETRON HCL 4 MG/2ML IJ SOLN: 4.0000 mg | Freq: Four times a day (QID) | INTRAMUSCULAR | Status: DC | PRN

## 2017-03-04 MED ORDER — SUGAMMADEX SODIUM 200 MG/2ML IV SOLN
INTRAVENOUS | Status: DC | PRN
  Administered 2017-03-04: 118 mg via INTRAVENOUS

## 2017-03-04 MED ORDER — ROCURONIUM BROMIDE 10 MG/ML (PF) SYRINGE
PREFILLED_SYRINGE | INTRAVENOUS | Status: DC | PRN
  Administered 2017-03-04 (×2): 20 mg via INTRAVENOUS
  Administered 2017-03-04: 30 mg via INTRAVENOUS
  Administered 2017-03-04: 50 mg via INTRAVENOUS

## 2017-03-04 MED ORDER — SUGAMMADEX SODIUM 200 MG/2ML IV SOLN
INTRAVENOUS | Status: AC
  Filled 2017-03-04: qty 2

## 2017-03-04 MED ORDER — MIDAZOLAM HCL 5 MG/5ML IJ SOLN
INTRAMUSCULAR | Status: DC | PRN
  Administered 2017-03-04: 2 mg via INTRAVENOUS

## 2017-03-04 MED ORDER — ROCURONIUM BROMIDE 10 MG/ML (PF) SYRINGE
PREFILLED_SYRINGE | INTRAVENOUS | Status: AC
  Filled 2017-03-04: qty 5

## 2017-03-04 MED ORDER — METOCLOPRAMIDE HCL 5 MG PO TABS: 5.0000 mg | ORAL_TABLET | Freq: Three times a day (TID) | ORAL | Status: DC | PRN

## 2017-03-04 MED ORDER — BISACODYL 5 MG PO TBEC: 5.0000 mg | DELAYED_RELEASE_TABLET | Freq: Every day | ORAL | Status: DC | PRN

## 2017-03-04 MED ORDER — PROPOFOL 10 MG/ML IV BOLUS
INTRAVENOUS | Status: AC
  Filled 2017-03-04: qty 20

## 2017-03-04 MED ORDER — ARIPIPRAZOLE 5 MG PO TABS
10.0000 mg | ORAL_TABLET | Freq: Every day | ORAL | Status: DC
  Administered 2017-03-05 – 2017-03-06 (×2): 10 mg via ORAL
  Filled 2017-03-04 (×2): qty 2

## 2017-03-04 MED ORDER — CEFAZOLIN SODIUM-DEXTROSE 2-4 GM/100ML-% IV SOLN
2.0000 g | INTRAVENOUS | Status: AC
  Administered 2017-03-04: 2 g via INTRAVENOUS

## 2017-03-04 MED ORDER — METHOCARBAMOL 500 MG PO TABS
ORAL_TABLET | ORAL | Status: AC
  Administered 2017-03-04: 500 mg via ORAL
  Filled 2017-03-04: qty 1

## 2017-03-04 MED ORDER — ONDANSETRON HCL 4 MG/2ML IJ SOLN
INTRAMUSCULAR | Status: AC
  Filled 2017-03-04: qty 2

## 2017-03-04 MED ORDER — LIDOCAINE 2% (20 MG/ML) 5 ML SYRINGE
INTRAMUSCULAR | Status: AC
  Filled 2017-03-04: qty 5

## 2017-03-04 MED ORDER — ONDANSETRON HCL 4 MG/2ML IJ SOLN
INTRAMUSCULAR | Status: DC | PRN
  Administered 2017-03-04: 4 mg via INTRAVENOUS

## 2017-03-04 MED ORDER — DOCUSATE SODIUM 100 MG PO CAPS
100.0000 mg | ORAL_CAPSULE | Freq: Two times a day (BID) | ORAL | Status: DC
  Administered 2017-03-04 – 2017-03-06 (×4): 100 mg via ORAL
  Filled 2017-03-04 (×4): qty 1

## 2017-03-04 MED ORDER — LAMOTRIGINE 100 MG PO TABS
200.0000 mg | ORAL_TABLET | Freq: Every day | ORAL | Status: DC
  Administered 2017-03-05 – 2017-03-06 (×2): 200 mg via ORAL
  Filled 2017-03-04 (×2): qty 2

## 2017-03-04 MED ORDER — LACTATED RINGERS IV SOLN
INTRAVENOUS | Status: DC
  Administered 2017-03-04 (×2): via INTRAVENOUS

## 2017-03-04 MED ORDER — DEXMETHYLPHENIDATE HCL ER 5 MG PO CP24
30.0000 mg | ORAL_CAPSULE | Freq: Every day | ORAL | Status: DC
  Administered 2017-03-06: 30 mg via ORAL
  Filled 2017-03-04 (×2): qty 6

## 2017-03-04 MED ORDER — ACETAMINOPHEN 325 MG PO TABS: 650.0000 mg | ORAL_TABLET | Freq: Four times a day (QID) | ORAL | Status: DC | PRN

## 2017-03-04 MED ORDER — METHOCARBAMOL 1000 MG/10ML IJ SOLN
1000.0000 mg | Freq: Four times a day (QID) | INTRAVENOUS | Status: DC | PRN
  Filled 2017-03-04: qty 10

## 2017-03-04 MED ORDER — OXYCODONE-ACETAMINOPHEN 5-325 MG PO TABS
1.0000 | ORAL_TABLET | Freq: Four times a day (QID) | ORAL | Status: DC | PRN
  Administered 2017-03-04 – 2017-03-06 (×7): 2 via ORAL
  Filled 2017-03-04 (×7): qty 2

## 2017-03-04 MED ORDER — POTASSIUM CHLORIDE IN NACL 20-0.9 MEQ/L-% IV SOLN
INTRAVENOUS | Status: DC
  Administered 2017-03-04 – 2017-03-05 (×2): via INTRAVENOUS
  Filled 2017-03-04 (×4): qty 1000

## 2017-03-04 MED ORDER — MEPERIDINE HCL 25 MG/ML IJ SOLN: 6.2500 mg | INTRAMUSCULAR | Status: DC | PRN

## 2017-03-04 MED ORDER — ONDANSETRON HCL 4 MG PO TABS: 4.0000 mg | ORAL_TABLET | Freq: Four times a day (QID) | ORAL | Status: DC | PRN

## 2017-03-04 MED ORDER — MAGNESIUM CITRATE PO SOLN: 1.0000 | Freq: Once | ORAL | Status: DC | PRN

## 2017-03-04 MED ORDER — HYDROMORPHONE HCL 1 MG/ML IJ SOLN
1.0000 mg | INTRAMUSCULAR | Status: DC | PRN
  Administered 2017-03-04: 1 mg via INTRAVENOUS
  Administered 2017-03-04 – 2017-03-05 (×2): 2 mg via INTRAVENOUS
  Administered 2017-03-05 (×2): 1 mg via INTRAVENOUS
  Administered 2017-03-05 (×2): 2 mg via INTRAVENOUS
  Administered 2017-03-06 (×2): 1 mg via INTRAVENOUS
  Administered 2017-03-06: 2 mg via INTRAVENOUS
  Filled 2017-03-04 (×2): qty 1
  Filled 2017-03-04 (×2): qty 2
  Filled 2017-03-04 (×2): qty 1
  Filled 2017-03-04 (×2): qty 2
  Filled 2017-03-04: qty 1
  Filled 2017-03-04: qty 2

## 2017-03-04 MED ORDER — LIDOCAINE 2% (20 MG/ML) 5 ML SYRINGE
INTRAMUSCULAR | Status: DC | PRN
  Administered 2017-03-04: 60 mg via INTRAVENOUS

## 2017-03-04 MED ORDER — ACETAMINOPHEN 10 MG/ML IV SOLN: 1000.0000 mg | Freq: Once | INTRAVENOUS | Status: DC

## 2017-03-04 MED ORDER — ACETAMINOPHEN 650 MG RE SUPP: 650.0000 mg | Freq: Four times a day (QID) | RECTAL | Status: DC | PRN

## 2017-03-04 MED ORDER — POLYETHYLENE GLYCOL 3350 17 G PO PACK
17.0000 g | PACK | Freq: Every day | ORAL | Status: DC
  Administered 2017-03-05 – 2017-03-06 (×2): 17 g via ORAL
  Filled 2017-03-04 (×2): qty 1

## 2017-03-04 MED ORDER — OXYCODONE HCL 5 MG PO TABS
5.0000 mg | ORAL_TABLET | ORAL | Status: DC | PRN
  Administered 2017-03-04 – 2017-03-06 (×6): 10 mg via ORAL
  Filled 2017-03-04 (×5): qty 2

## 2017-03-04 MED ORDER — 0.9 % SODIUM CHLORIDE (POUR BTL) OPTIME
TOPICAL | Status: DC | PRN
  Administered 2017-03-04: 1000 mL

## 2017-03-04 MED ORDER — CEFAZOLIN SODIUM-DEXTROSE 2-4 GM/100ML-% IV SOLN
INTRAVENOUS | Status: AC
  Filled 2017-03-04: qty 100

## 2017-03-04 MED ORDER — DEXMETHYLPHENIDATE HCL ER 5 MG PO CP24: 30.0000 mg | ORAL_CAPSULE | Freq: Every day | ORAL | Status: DC

## 2017-03-04 MED ORDER — CHLORHEXIDINE GLUCONATE 4 % EX LIQD: 60.0000 mL | Freq: Once | CUTANEOUS | Status: DC

## 2017-03-04 MED ORDER — HYDROMORPHONE HCL 1 MG/ML IJ SOLN
INTRAMUSCULAR | Status: AC
  Administered 2017-03-04: 0.5 mg via INTRAVENOUS
  Filled 2017-03-04: qty 1

## 2017-03-04 MED ORDER — ENOXAPARIN SODIUM 40 MG/0.4ML ~~LOC~~ SOLN
40.0000 mg | SUBCUTANEOUS | Status: DC
  Administered 2017-03-05 – 2017-03-06 (×2): 40 mg via SUBCUTANEOUS
  Filled 2017-03-04 (×2): qty 0.4

## 2017-03-04 MED ORDER — FENTANYL CITRATE (PF) 250 MCG/5ML IJ SOLN
INTRAMUSCULAR | Status: AC
  Filled 2017-03-04: qty 5

## 2017-03-04 MED ORDER — MODAFINIL 100 MG PO TABS
200.0000 mg | ORAL_TABLET | Freq: Every day | ORAL | Status: DC
  Administered 2017-03-05 – 2017-03-06 (×2): 200 mg via ORAL
  Filled 2017-03-04 (×2): qty 2

## 2017-03-04 MED ORDER — HYDROMORPHONE HCL 1 MG/ML IJ SOLN
0.2500 mg | INTRAMUSCULAR | Status: DC | PRN
  Administered 2017-03-04 (×4): 0.5 mg via INTRAVENOUS

## 2017-03-04 MED ORDER — MIDAZOLAM HCL 2 MG/2ML IJ SOLN
INTRAMUSCULAR | Status: AC
  Filled 2017-03-04: qty 2

## 2017-03-04 MED ORDER — CEFAZOLIN SODIUM-DEXTROSE 1-4 GM/50ML-% IV SOLN
1.0000 g | Freq: Four times a day (QID) | INTRAVENOUS | Status: AC
  Administered 2017-03-04 – 2017-03-05 (×2): 1 g via INTRAVENOUS
  Filled 2017-03-04 (×3): qty 50

## 2017-03-04 MED ORDER — FENTANYL CITRATE (PF) 250 MCG/5ML IJ SOLN
INTRAMUSCULAR | Status: DC | PRN
  Administered 2017-03-04: 100 ug via INTRAVENOUS
  Administered 2017-03-04 (×3): 50 ug via INTRAVENOUS

## 2017-03-04 MED ORDER — DEXMETHYLPHENIDATE HCL ER 5 MG PO CP24
30.0000 mg | ORAL_CAPSULE | Freq: Every day | ORAL | Status: DC
  Filled 2017-03-04 (×2): qty 2

## 2017-03-04 MED ORDER — METHOCARBAMOL 500 MG PO TABS
500.0000 mg | ORAL_TABLET | Freq: Four times a day (QID) | ORAL | Status: DC | PRN
  Administered 2017-03-04: 500 mg via ORAL
  Administered 2017-03-05 – 2017-03-06 (×5): 1000 mg via ORAL
  Filled 2017-03-04 (×5): qty 2

## 2017-03-04 MED ORDER — DULOXETINE HCL 20 MG PO CPEP
20.0000 mg | ORAL_CAPSULE | Freq: Every day | ORAL | Status: DC
  Administered 2017-03-05 – 2017-03-06 (×2): 20 mg via ORAL
  Filled 2017-03-04 (×3): qty 1

## 2017-03-04 MED ORDER — PROMETHAZINE HCL 25 MG/ML IJ SOLN: 6.2500 mg | INTRAMUSCULAR | Status: DC | PRN

## 2017-03-04 MED ORDER — OXYCODONE HCL 5 MG PO TABS
ORAL_TABLET | ORAL | Status: AC
  Administered 2017-03-04: 10 mg via ORAL
  Filled 2017-03-04: qty 2

## 2017-03-04 MED ORDER — SCOPOLAMINE 1 MG/3DAYS TD PT72
MEDICATED_PATCH | TRANSDERMAL | Status: DC | PRN
  Administered 2017-03-04: 1 via TRANSDERMAL

## 2017-03-04 MED ORDER — SCOPOLAMINE 1 MG/3DAYS TD PT72
MEDICATED_PATCH | TRANSDERMAL | Status: AC
Start: 2017-03-04 — End: 2017-03-04
  Filled 2017-03-04: qty 1

## 2017-03-04 MED ORDER — METOCLOPRAMIDE HCL 5 MG/ML IJ SOLN: 5.0000 mg | Freq: Three times a day (TID) | INTRAMUSCULAR | Status: DC | PRN

## 2017-03-04 SURGICAL SUPPLY — 108 items
BANDAGE ACE 4X5 VEL STRL LF (GAUZE/BANDAGES/DRESSINGS) ×4 IMPLANT
BANDAGE ACE 6X5 VEL STRL LF (GAUZE/BANDAGES/DRESSINGS) ×4 IMPLANT
BANDAGE ESMARK 6X9 LF (GAUZE/BANDAGES/DRESSINGS) ×2 IMPLANT
BIT DRILL 100X2.5XANTM LCK (BIT) IMPLANT
BIT DRILL 2.9 CANN QC NONSTRL (BIT) ×2 IMPLANT
BIT DRL 100X2.5XANTM LCK (BIT) ×2
BLADE CLIPPER SURG (BLADE) IMPLANT
BLADE SURG 10 STRL SS (BLADE) ×4 IMPLANT
BLADE SURG 15 STRL LF DISP TIS (BLADE) ×2 IMPLANT
BLADE SURG 15 STRL SS (BLADE) ×4
BNDG CMPR 9X6 STRL LF SNTH (GAUZE/BANDAGES/DRESSINGS) ×2
BNDG COHESIVE 4X5 TAN STRL (GAUZE/BANDAGES/DRESSINGS) ×4 IMPLANT
BNDG ESMARK 6X9 LF (GAUZE/BANDAGES/DRESSINGS) ×4
BNDG GAUZE ELAST 4 BULKY (GAUZE/BANDAGES/DRESSINGS) ×4 IMPLANT
BONE CANC CHIPS 40CC CAN1/2 (Bone Implant) ×4 IMPLANT
BRUSH SCRUB SURG 4.25 DISP (MISCELLANEOUS) ×8 IMPLANT
CANISTER SUCT 3000ML PPV (MISCELLANEOUS) ×4 IMPLANT
CANISTER WOUND CARE 500ML ATS (WOUND CARE) IMPLANT
CHIPS CANC BONE 40CC CAN1/2 (Bone Implant) ×2 IMPLANT
COVER SURGICAL LIGHT HANDLE (MISCELLANEOUS) ×8 IMPLANT
CUFF TOURNIQUET SINGLE 24IN (TOURNIQUET CUFF) IMPLANT
CUFF TOURNIQUET SINGLE 34IN LL (TOURNIQUET CUFF) ×4 IMPLANT
DRAPE C-ARM 42X72 X-RAY (DRAPES) ×4 IMPLANT
DRAPE C-ARMOR (DRAPES) ×4 IMPLANT
DRAPE HALF SHEET 40X57 (DRAPES) IMPLANT
DRAPE INCISE IOBAN 66X45 STRL (DRAPES) ×4 IMPLANT
DRAPE ORTHO SPLIT 77X108 STRL (DRAPES) ×8
DRAPE SURG ORHT 6 SPLT 77X108 (DRAPES) ×4 IMPLANT
DRAPE U-SHAPE 47X51 STRL (DRAPES) ×4 IMPLANT
DRILL BIT 2.5MM (BIT) ×4
DRILL BIT 2.7X100 214235006 DU (MISCELLANEOUS) ×2 IMPLANT
DRSG ADAPTIC 3X8 NADH LF (GAUZE/BANDAGES/DRESSINGS) ×4 IMPLANT
DRSG MEPITEL 4X7.2 (GAUZE/BANDAGES/DRESSINGS) ×2 IMPLANT
DRSG PAD ABDOMINAL 8X10 ST (GAUZE/BANDAGES/DRESSINGS) ×8 IMPLANT
DRSG VAC ATS LRG SENSATRAC (GAUZE/BANDAGES/DRESSINGS) IMPLANT
DRSG VAC ATS MED SENSATRAC (GAUZE/BANDAGES/DRESSINGS) IMPLANT
DRSG VAC ATS SM SENSATRAC (GAUZE/BANDAGES/DRESSINGS) IMPLANT
ELECT REM PT RETURN 9FT ADLT (ELECTROSURGICAL) ×4
ELECTRODE REM PT RTRN 9FT ADLT (ELECTROSURGICAL) ×2 IMPLANT
GAUZE SPONGE 4X4 12PLY STRL (GAUZE/BANDAGES/DRESSINGS) ×4 IMPLANT
GLOVE BIO SURGEON STRL SZ7.5 (GLOVE) ×4 IMPLANT
GLOVE BIO SURGEON STRL SZ8 (GLOVE) ×4 IMPLANT
GLOVE BIOGEL PI IND STRL 7.5 (GLOVE) ×2 IMPLANT
GLOVE BIOGEL PI IND STRL 8 (GLOVE) ×2 IMPLANT
GLOVE BIOGEL PI INDICATOR 7.5 (GLOVE) ×2
GLOVE BIOGEL PI INDICATOR 8 (GLOVE) ×2
GOWN STRL REUS W/ TWL LRG LVL3 (GOWN DISPOSABLE) ×4 IMPLANT
GOWN STRL REUS W/ TWL XL LVL3 (GOWN DISPOSABLE) ×2 IMPLANT
GOWN STRL REUS W/TWL LRG LVL3 (GOWN DISPOSABLE) ×8
GOWN STRL REUS W/TWL XL LVL3 (GOWN DISPOSABLE) ×4
GRAFT BNE CHIP CANC 1-8 40 (Bone Implant) IMPLANT
IMMOBILIZER KNEE 22 UNIV (SOFTGOODS) ×4 IMPLANT
K-WIRE ACE 1.6X6 (WIRE) ×36
KIT BASIN OR (CUSTOM PROCEDURE TRAY) ×4 IMPLANT
KIT ROOM TURNOVER OR (KITS) ×4 IMPLANT
KWIRE ACE 1.6X6 (WIRE) IMPLANT
MANIFOLD NEPTUNE II (INSTRUMENTS) ×4 IMPLANT
NDL SUT 6 .5 CRC .975X.05 MAYO (NEEDLE) IMPLANT
NEEDLE MAYO TAPER (NEEDLE)
NS IRRIG 1000ML POUR BTL (IV SOLUTION) ×4 IMPLANT
PACK GENERAL/GYN (CUSTOM PROCEDURE TRAY) ×4 IMPLANT
PACK ORTHO EXTREMITY (CUSTOM PROCEDURE TRAY) ×4 IMPLANT
PAD ABD 8X10 STRL (GAUZE/BANDAGES/DRESSINGS) ×2 IMPLANT
PAD ARMBOARD 7.5X6 YLW CONV (MISCELLANEOUS) ×8 IMPLANT
PAD CAST 4YDX4 CTTN HI CHSV (CAST SUPPLIES) ×2 IMPLANT
PADDING CAST COTTON 4X4 STRL (CAST SUPPLIES) ×4
PADDING CAST COTTON 6X4 STRL (CAST SUPPLIES) ×4 IMPLANT
PLATE LOCK 5H STD LT PROX TIB (Plate) ×2 IMPLANT
SCREW ACE CAN 4.0 55M (Screw) ×2 IMPLANT
SCREW CORTICAL 3.5MM  28MM (Screw) ×2 IMPLANT
SCREW CORTICAL 3.5MM  30MM (Screw) ×2 IMPLANT
SCREW CORTICAL 3.5MM  34MM (Screw) ×2 IMPLANT
SCREW CORTICAL 3.5MM 28MM (Screw) IMPLANT
SCREW CORTICAL 3.5MM 30MM (Screw) IMPLANT
SCREW CORTICAL 3.5MM 34MM (Screw) IMPLANT
SCREW LOCK CORT STAR 3.5X54 (Screw) ×2 IMPLANT
SCREW LOCK CORT STAR 3.5X65 (Screw) ×4 IMPLANT
SCREW LOCK CORT STAR 3.5X70 (Screw) ×4 IMPLANT
SCREW LP 3.5X65MM (Screw) ×2 IMPLANT
SCREW LP 3.5X70MM (Screw) ×2 IMPLANT
SET MONITOR QUICK PRESSURE (MISCELLANEOUS) IMPLANT
SPONGE LAP 18X18 X RAY DECT (DISPOSABLE) ×4 IMPLANT
STAPLER VISISTAT 35W (STAPLE) ×4 IMPLANT
STOCKINETTE IMPERVIOUS 9X36 MD (GAUZE/BANDAGES/DRESSINGS) ×4 IMPLANT
STOCKINETTE IMPERVIOUS LG (DRAPES) ×4 IMPLANT
SUCTION FRAZIER HANDLE 10FR (MISCELLANEOUS) ×2
SUCTION TUBE FRAZIER 10FR DISP (MISCELLANEOUS) ×2 IMPLANT
SUT ETHILON 2 0 FSLX (SUTURE) IMPLANT
SUT ETHILON 3 0 PS 1 (SUTURE) IMPLANT
SUT PROLENE 0 CT 1 30 (SUTURE) ×2 IMPLANT
SUT PROLENE 0 CT 2 (SUTURE) ×8 IMPLANT
SUT VIC AB 0 CT1 27 (SUTURE) ×4
SUT VIC AB 0 CT1 27XBRD ANBCTR (SUTURE) ×2 IMPLANT
SUT VIC AB 0 CTB1 27 (SUTURE) IMPLANT
SUT VIC AB 1 CT1 27 (SUTURE) ×4
SUT VIC AB 1 CT1 27XBRD ANBCTR (SUTURE) ×2 IMPLANT
SUT VIC AB 2-0 CT1 27 (SUTURE) ×8
SUT VIC AB 2-0 CT1 TAPERPNT 27 (SUTURE) ×4 IMPLANT
SUT VIC AB 2-0 CT3 27 (SUTURE) IMPLANT
SUT VIC AB 2-0 CTB1 (SUTURE) IMPLANT
SYR 5ML LL (SYRINGE) ×4 IMPLANT
TOWEL OR 17X24 6PK STRL BLUE (TOWEL DISPOSABLE) ×4 IMPLANT
TOWEL OR 17X26 10 PK STRL BLUE (TOWEL DISPOSABLE) ×8 IMPLANT
TRAY FOLEY W/METER SILVER 16FR (SET/KITS/TRAYS/PACK) IMPLANT
TUBE CONNECTING 12'X1/4 (SUCTIONS) ×1
TUBE CONNECTING 12X1/4 (SUCTIONS) ×3 IMPLANT
WATER STERILE IRR 1000ML POUR (IV SOLUTION) ×8 IMPLANT
YANKAUER SUCT BULB TIP NO VENT (SUCTIONS) ×4 IMPLANT

## 2017-03-04 NOTE — Brief Op Note (Signed)
03/04/2017  11:06 AM  PATIENT:  Alice [name]  42 y.o. female  PRE-OPERATIVE DIAGNOSIS:  LEFT BICONDYLAR TIBIAL PLATEAU  POST-OPERATIVE DIAGNOSIS:  1. LEFT BICONDYLAR TIBIAL PLATEAU 2. LATERAL MENISCUS TEAR  PROCEDURE:  Procedure(s): 1. OPEN REDUCTION INTERNAL FIXATION (ORIF) TIBIAL PLATEAU (Left) 2. ANTERIOR COMPARTMENT FASCIOTOMY (Left) 3. LATERAL MENISCUS REPAIR THROUGH ARTHROTOMY  SURGEON:  Surgeon(s) and Role:    * Myrene Galas, MD - Primary  PHYSICIAN ASSISTANT: Montez Morita, PA-C  ANESTHESIA:   general  EBL:  Total I/O In: 1000 [I.V.:1000] Out: 100 [Blood:100]  BLOOD ADMINISTERED:none  DRAINS: none   LOCAL MEDICATIONS USED:  NONE  SPECIMEN:  No Specimen  DISPOSITION OF SPECIMEN:  N/A  COUNTS:  YES  TOURNIQUET:    DICTATION: Epic Note  PLAN OF CARE: Admit to inpatient   PATIENT DISPOSITION:  PACU - hemodynamically stable.   Delay start of Pharmacological VTE agent (>24hrs) due to surgical blood loss or risk of bleeding: no

## 2017-03-04 NOTE — Op Note (Signed)
03/04/2017  11:06 AM  PATIENT:  Gilda Crease  41 y.o. female  PRE-OPERATIVE DIAGNOSIS:  LEFT BICONDYLAR TIBIAL PLATEAU  POST-OPERATIVE DIAGNOSIS:  1. LEFT BICONDYLAR TIBIAL PLATEAU 2. LATERAL MENISCUS TEAR  PROCEDURE:  Procedure(s): 1. OPEN REDUCTION INTERNAL FIXATION (ORIF) TIBIAL PLATEAU (Left) 2. ANTERIOR COMPARTMENT FASCIOTOMY (Left) 3. LATERAL MENISCUS REPAIR THROUGH ARTHROTOMY  SURGEON:  Surgeon(s) and Role:    * Myrene Galas, MD - Primary  PHYSICIAN ASSISTANT: Montez Morita, PA-C  ANESTHESIA:   general  EBL:  Total I/O In: 1000 [I.V.:1000] Out: 100 [Blood:100]  BLOOD ADMINISTERED:none  DRAINS: none   LOCAL MEDICATIONS USED:  NONE  SPECIMEN:  No Specimen  DISPOSITION OF SPECIMEN:  N/A  COUNTS:  YES  TOURNIQUET:  none  PLAN OF CARE: Admit to inpatient   PATIENT DISPOSITION:  PACU - hemodynamically stable.   Delay start of Pharmacological VTE agent (>24hrs) due to surgical blood loss or risk of bleeding: no    BRIEF SUMMARY AND INDICATION FOR PROCEDURE:  Patient is a 41 y.o.-year- old with a tibial plateau fracture, treated provisionally with aggressive ice, elevation, and active motion of the foot and toes to facilitate resolution of soft tissue swelling.  We did discuss with the patient the risks and benefits of surgical treatment including the potential for arthritis, nerve injury, vessel injury, loss of motion, DVT, PE, heart attack, stroke, symptomatic hardware, need for further surgery, and multiple others.  The patient acknowledged these risks and wished to proceed.  BRIEF SUMMARY OF PROCEDURE:  After administration of preoperative antibiotics, the patient was taken to the operating room.  General anesthesia was induced and the lower extremity prepped and draped in usual sterile fashion using a chlorhexidine wash and betadine scrub and paint. A timeout was performed. I then brought in the radiolucent triangle.  A curvilinear incision was  made extending laterally over Gerdy's tubercle. Dissection was carried down where the soft tissues were left intact to the lateral plateau and rim.  We then released some of the anterior extensors to enable the plate to fit along the proximal shaft. Beginning on the medial side, a three centimeter incision was made and the soft tissues spread down to the posteromedial edge of the articular surface. I begin with the Darrick Penna clamp but switched to the sharp tenaculum clamp to gain improved angle and power to reduce coronal fracture plane. An anterior to posterior partially threaded cannulated screw was then placed. On the lateral side I did incise the retinaculum proximal to the tibial plateau, and then going along inside the retinaculum performed a submeniscal arthrotomy , releasing the coronary ligament along its insertion onto the tibia. Zero prolene suture was used to reflect this and inspect the meniscus and joint surface. The joint was irrigated thoroughly and this revealed a mid body horizontal lateral meniscus tear near the capsule.  Four zero prolene vertical mattress sutures were used to repair the meniscus tear.  The joint surface was markedly depressed with increase in the slope.  I inserted three anterior to posterior K wires which were used as joysticks while my assistant pulled traction. A trapdoor was made in the lateral tibial metaphysis.  Through this, I introduced a series of tamps and with my assistant pulling traction, I was able to elevate the articular surface in sequential fashion while watching it through the arthrotomy.  Once I had restored appropriate height, the bone defect was grafted with cancellous chips.  I then placed the plate laterally and used the [facility]  Tong clamp to apply compressive force across the joint line. This reduced the widened plateau back to the appropriate size.  At this point, we placed standard fixation in the proximal row of the plate followed by locked  fixation.  The defect in the metaphysis was filled with cancellous bone and tamped into place. This was followed by additional fixation within the shaft. My assistant was careful to control alignment throughout by using traction and bending forces. He also assited with retraction. All wounds were irrigated thoroughly.  Prior to closure, I turned my attention to the distal edge of the wound here underneath the skin.  I used the long scissors to spread both superficial and deep to the anterior compartment.  The fascia was then released for 8 to 10 cm to reduce the likelihood of the postoperative compartment syndrome.  Once more, wound was irrigated and then a standard layered closure performed, 0 Vicryl, 2-0 Vicryl, and 3-0 nylon for the skin.  Sterile gently compressive dressing was applied and in knee immobilizer.  The patient was taken to the PACU in stable condition.  PROGNOSIS: The patient will be transitioned into a hinged knee brace with unrestricted range of motion and this will begin immediately.  She will be nonweightbearing on the operative extremity, be on pharmacologic DVT prophylaxis, and mobilized with PT and OT. After discharge, we will plan to see her back in about 2 weeks for removal of sutures and we will continue to follow throughout the hospital stay.     Doralee Albino. Carola Frost, M.D.

## 2017-03-04 NOTE — Anesthesia Postprocedure Evaluation (Signed)
Anesthesia Post Note  Patient: Alice [name]  Procedure(s) Performed: Procedure(s) (LRB): OPEN REDUCTION INTERNAL FIXATION (ORIF) TIBIAL PLATEAU (Left) ANTERIOR COMPARTMENT FASCIOTOMY (Left)     Patient location during evaluation: PACU Anesthesia Type: General Level of consciousness: awake and alert and oriented Pain management: pain level controlled Vital Signs Assessment: post-procedure vital signs reviewed and stable Respiratory status: spontaneous breathing, nonlabored ventilation, respiratory function stable and patient connected to nasal cannula oxygen Cardiovascular status: blood pressure returned to baseline and stable Postop Assessment: no apparent nausea or vomiting Anesthetic complications: no    Last Vitals:  Vitals:   03/04/17 1120 03/04/17 1135  BP: 129/70 121/75  Pulse: 88 82  Resp: (!) 23 19  Temp:    SpO2: 100% 100%    Last Pain:  Vitals:   03/04/17 1145  TempSrc:   PainSc: 8                  Alice [name] A.

## 2017-03-04 NOTE — Progress Notes (Signed)
Orthopedic Tech Progress Note Patient Details:  Alice [name] 1976/02/01 [id number]  Patient ID: Alice [name], female   DOB: 03-31-1976, 42 y.o.   MRN: [id number]   Alice [name] 03/04/2017, 3:59 PM Called in advanced brace order; spoke with Juanda Chance

## 2017-03-04 NOTE — Transfer of Care (Signed)
Immediate Anesthesia Transfer of Care Note  Patient: GLENNYS SCHORSCH  Procedure(s) Performed: Procedure(s): OPEN REDUCTION INTERNAL FIXATION (ORIF) TIBIAL PLATEAU (Left) ANTERIOR COMPARTMENT FASCIOTOMY (Left)  Patient Location: PACU  Anesthesia Type:General  Level of Consciousness: drowsy and patient cooperative  Airway & Oxygen Therapy: Patient Spontanous Breathing and Patient connected to face mask oxygen  Post-op Assessment: Report given to RN and Post -op Vital signs reviewed and stable  Post vital signs: Reviewed and stable  Last Vitals:  Vitals:   03/04/17 0603  BP: 115/61  Pulse: 69  Resp: 19  Temp: 36.8 C  SpO2: 98%    Last Pain:  Vitals:   03/04/17 0701  TempSrc:   PainSc: 8       Patients Stated Pain Goal: 5 (03/04/17 0701)  Complications: No apparent anesthesia complications

## 2017-03-04 NOTE — Anesthesia Procedure Notes (Signed)
Procedure Name: Intubation Date/Time: 03/04/2017 8:20 AM Performed by: Freddie Breech Pre-anesthesia Checklist: Patient identified, Emergency Drugs available, Suction available and Patient being monitored Patient Re-evaluated:Patient Re-evaluated prior to induction Oxygen Delivery Method: Circle System Utilized Preoxygenation: Pre-oxygenation with 100% oxygen Induction Type: IV induction Ventilation: Mask ventilation without difficulty Laryngoscope Size: Mac and 3 Grade View: Grade I Tube type: Oral Number of attempts: 1 Airway Equipment and Method: Stylet and Oral airway Placement Confirmation: ETT inserted through vocal cords under direct vision,  positive ETCO2 and breath sounds checked- equal and bilateral Secured at: 21 cm Tube secured with: Tape Dental Injury: Teeth and Oropharynx as per pre-operative assessment

## 2017-03-04 NOTE — H&P (Signed)
Orthopaedic Trauma Service H&P/Consult     Patient ID: Alice [name] MRN: [id number] DOB/AGE: 39-Apr-1977 39 y.o.  Chief Complaint: Left bicondylar plateau HPI: Alice [name] is an 41 y.o. female fell down some stairs with pop in left knee and inability to bear weight.  Past Medical History:  Diagnosis Date  . ADHD (attention deficit hyperactivity disorder)   . Avascular necrosis of bone of hip (HCC)    RIGHT GRAFT  . Bipolar II disorder (HCC)   . Depression   . PONV (postoperative nausea and vomiting)   . Wears glasses     Past Surgical History:  Procedure Laterality Date  . APPENDECTOMY  2000  . BREAST SURGERY  06/2009   RIGHT BREAST BIOPSY-NEGATIVE  . DILATION AND CURETTAGE OF UTERUS    . HIP ARTHROSCOPY  2008, 2010   AVASCULAR NECROSIS--RIGHT HIP 2010  . KNEE SURGERY  1995   ACL  . SHOULDER SURGERY  [phone]  . TONSILLECTOMY    . WISDOM TOOTH EXTRACTION      Family History  Problem Relation Age of Onset  . Breast cancer Mother 64       BRACA NEGATIVE  . Hypertension Father   . Heart disease Father   . Cancer Father        PROSTATE   Social History:  reports that she has been smoking Cigarettes.  She has never used smokeless tobacco. She reports that she drinks alcohol. She reports that she does not use drugs.  Allergies:  Allergies  Allergen Reactions  . Methotrexate Derivatives     UNSPECIFIED REACTION   . Chantix [Varenicline Tartrate] Rash    Medications Prior to Admission  Medication Sig Dispense Refill  . ARIPiprazole (ABILIFY) 10 MG tablet Take 1 tablet (10 mg total) by mouth daily. 90 tablet 1  . Dexmethylphenidate HCl (FOCALIN XR) 30 MG CP24 Take 1 capsule (30 mg total) by mouth daily. 30 capsule 0  . DULoxetine (CYMBALTA) 20 MG capsule Take 20 mg by mouth daily.    Marland Kitchen enoxaparin (LOVENOX) 40 MG/0.4ML injection Inject 40 mg into the skin daily.     Marland Kitchen ibuprofen (ADVIL,MOTRIN) 600 MG tablet Take 1 tablet (600 mg total) by mouth every  6 (six) hours as needed. 30 tablet 0  . lamoTRIgine (LAMICTAL) 200 MG tablet Take 1 tablet (200 mg total) by mouth daily. 90 tablet 1  . modafinil (PROVIGIL) 200 MG tablet Take 1 tablet (200 mg total) by mouth daily. 30 tablet 2  . oxyCODONE-acetaminophen (PERCOCET/ROXICET) 5-325 MG tablet Take 1-2 tablets by mouth every 6 (six) hours as needed for severe pain. 20 tablet 0    Results for orders placed or performed during the hospital encounter of 03/04/17 (from the past 48 hour(s))  Protime-INR     Status: None   Collection Time: 03/04/17  7:10 AM  Result Value Ref Range   Prothrombin Time 13.2 11.4 - 15.2 seconds   INR 1.01   APTT     Status: None   Collection Time: 03/04/17  7:10 AM  Result Value Ref Range   aPTT 34 24 - 36 seconds  CBC with Differential/Platelet     Status: Abnormal   Collection Time: 03/04/17  7:10 AM  Result Value Ref Range   WBC 7.7 4.0 - 10.5 K/uL   RBC 3.97 3.87 - 5.11 MIL/uL   Hemoglobin 11.7 (L) 12.0 - 15.0 g/dL   HCT 95.6 21.3 - 08.6 %   MCV 90.7 78.0 - 100.0 fL  MCH 29.5 26.0 - 34.0 pg   MCHC 32.5 30.0 - 36.0 g/dL   RDW 16.1 09.6 - 04.5 %   Platelets 258 150 - 400 K/uL   Neutrophils Relative % 60 %   Neutro Abs 4.7 1.7 - 7.7 K/uL   Lymphocytes Relative 28 %   Lymphs Abs 2.1 0.7 - 4.0 K/uL   Monocytes Relative 7 %   Monocytes Absolute 0.5 0.1 - 1.0 K/uL   Eosinophils Relative 4 %   Eosinophils Absolute 0.3 0.0 - 0.7 K/uL   Basophils Relative 1 %   Basophils Absolute 0.1 0.0 - 0.1 K/uL  Hemoglobin A1c     Status: None   Collection Time: 03/04/17  7:13 AM  Result Value Ref Range   Hgb A1c MFr Bld 5.0 4.8 - 5.6 %    Comment: (NOTE) Pre diabetes:          5.7%-6.4% Diabetes:              >6.4% Glycemic control for   <7.0% adults with diabetes    Mean Plasma Glucose 96.8 mg/dL  Urinalysis, Routine w reflex microscopic     Status: Abnormal   Collection Time: 03/04/17  7:14 AM  Result Value Ref Range   Color, Urine YELLOW YELLOW   APPearance  HAZY (A) CLEAR   Specific Gravity, Urine 1.031 (H) 1.005 - 1.030   pH 5.0 5.0 - 8.0   Glucose, UA NEGATIVE NEGATIVE mg/dL   Hgb urine dipstick MODERATE (A) NEGATIVE   Bilirubin Urine NEGATIVE NEGATIVE   Ketones, ur NEGATIVE NEGATIVE mg/dL   Protein, ur 30 (A) NEGATIVE mg/dL   Nitrite NEGATIVE NEGATIVE   Leukocytes, UA TRACE (A) NEGATIVE   RBC / HPF 0-5 0 - 5 RBC/hpf   WBC, UA 0-5 0 - 5 WBC/hpf   Bacteria, UA RARE (A) NONE SEEN   Squamous Epithelial / LPF 6-30 (A) NONE SEEN   Mucus PRESENT    No results found.  ROS No recent fever, bleeding abnormalities, urologic dysfunction, GI problems, or weight gain.  Blood pressure 115/61, pulse 69, temperature 98.3 F (36.8 C), temperature source Oral, resp. rate 19, last menstrual period 02/28/2017, SpO2 98 %. Physical Exam  RRR No wheezing S/NT/ND LLE No traumatic wounds or rash, ecchymosis present  Tender, knee effusion  Sens DPN, SPN, TN intact  Motor EHL, ext, flex, evers 5/5  DP 2+, PT 2+, No significant edema   Assessment/Plan  Left tibial plateau fracture, bicondylar  1. ORIF 2. Lovenox 3. NWB with unrestricted motion left knee  Myrene Galas, MD [facility] [phone] (p)  03/04/2017, 7:53 AM

## 2017-03-04 NOTE — Progress Notes (Signed)
Orthopedic Tech Progress Note Patient Details:  Alice [name] 09/09/1975 [id number]  Ortho Devices Ortho Device/Splint Location: applied bone foam zero degree to pt left foot.  pt tolerated application very well.   Left Foot.  Ortho Device/Splint Interventions: Application, Adjustment   Alvina Chou 03/04/2017, 3:42 PM

## 2017-03-05 ENCOUNTER — Encounter (HOSPITAL_COMMUNITY): Payer: Self-pay | Admitting: Orthopedic Surgery

## 2017-03-05 LAB — PTH, INTACT AND CALCIUM
CALCIUM TOTAL (PTH): 9.2 mg/dL (ref 8.7–10.2)
PTH: 30 pg/mL (ref 15–65)

## 2017-03-05 LAB — CBC
HEMATOCRIT: 34.2 % — AB (ref 36.0–46.0)
HEMOGLOBIN: 10.7 g/dL — AB (ref 12.0–15.0)
MCH: 28.6 pg (ref 26.0–34.0)
MCHC: 31.3 g/dL (ref 30.0–36.0)
MCV: 91.4 fL (ref 78.0–100.0)
Platelets: 351 10*3/uL (ref 150–400)
RBC: 3.74 MIL/uL — ABNORMAL LOW (ref 3.87–5.11)
RDW: 12.7 % (ref 11.5–15.5)
WBC: 12.9 10*3/uL — ABNORMAL HIGH (ref 4.0–10.5)

## 2017-03-05 LAB — BASIC METABOLIC PANEL
ANION GAP: 6 (ref 5–15)
BUN: 7 mg/dL (ref 6–20)
CO2: 27 mmol/L (ref 22–32)
Calcium: 8.5 mg/dL — ABNORMAL LOW (ref 8.9–10.3)
Chloride: 106 mmol/L (ref 101–111)
Creatinine, Ser: 0.73 mg/dL (ref 0.44–1.00)
GFR calc Af Amer: >60 mL/min (ref >60)
GFR calc non Af Amer: >60 mL/min (ref >60)
GLUCOSE: 122 mg/dL — AB (ref 65–99)
POTASSIUM: 4.8 mmol/L (ref 3.5–5.1)
Sodium: 139 mmol/L (ref 135–145)

## 2017-03-05 LAB — URINE CULTURE

## 2017-03-05 LAB — CALCITRIOL (1,25 DI-OH VIT D): Vit D, 1,25-Dihydroxy: 25.6 pg/mL (ref 19.9–79.3)

## 2017-03-05 LAB — CALCIUM, IONIZED: CALCIUM, IONIZED, SERUM: 5.1 mg/dL (ref 4.5–5.6)

## 2017-03-05 LAB — VITAMIN D 25 HYDROXY (VIT D DEFICIENCY, FRACTURES): VIT D 25 HYDROXY: 32.9 ng/mL (ref 30.0–100.0)

## 2017-03-05 NOTE — Care Management Note (Addendum)
Case Management Note  Patient Details  Name: Alice [name] MRN: [id number] Date of Birth: Nov 03, 1975  Subjective/Objective:    41 yr old female admitted s/p fall with left Tibial Plateau fracture. Patient s/p ORIF of left tibia plateau fracture.                Action/Plan: Case manager spoke with patient concerning discharge planning and DME. Choice for Home Health Agency was offered, referral was called to [facility], [facility].  Patient will use crutches for ambulation.    Expected Discharge Date:   03/06/17               Expected Discharge Plan:  Home w Home Health Services  In-House Referral:  NA  Discharge planning Services  CM Consult  Post Acute Care Choice:  Durable Medical Equipment, Home Health Choice offered to:  Patient  DME Arranged:  RW, 3in1 [facility]  HH Arranged:  PT HH Agency:  [facility]  Status of Service:  Completed, signed off  If discussed at Long Length of Stay Meetings, dates discussed:    Additional Comments:  Durenda Guthrie, RN 03/05/2017, 10:24 AM

## 2017-03-05 NOTE — Evaluation (Addendum)
Occupational Therapy Evaluation Patient Details Name: Alice [name] MRN: [id number] DOB: 07-17-1975 Today's Date: 03/05/2017    History of Present Illness Pt is a 41 y.o. female admitted after falling down stairs and sustaining a L bicondylar tibial plateau fx and lateral meniscus tear. Now s/p tibial ORIF, ant compartment fasciotomy, and lat meniscus repair on 03/04/17. Pertinent PMH includes avascular necrosis of hip, ADHD, depression, bipolar disorder. Pertinent surgical hx includes ACL repair (1995), hip arthroscopy (2008, 2010), multiple shoulder sxs.     Clinical Impression   This 41 y/o F presents with the above. At baseline Pt is independent with ADLs and functional mobility, was using crutches for approx 1 week prior to ORIF. Pt currently requires MinA at [facility], demonstrating good understanding of maintaining NWB in LLE during mobility. Pt requires MaxA for LB ADLs, setup/supervision assist for seated UB/grooming ADLs. Pt reports she will have intermittent assist from friends/family after discharge home. Pt will benefit from continued OT services in acute and post acute settings to maximize Pt's safety and independence with ADLs and functional mobility upon discharge home.     Follow Up Recommendations  DC plan and follow up therapy as arranged by surgeon;Home health OT;Supervision/Assistance - 24 hour    Equipment Recommendations  3 in 1 bedside commode           Precautions / Restrictions Precautions Precautions: Knee Precaution Comments: reviewed no pillow under bend of knee  Required Braces or Orthoses: Other Brace/Splint Other Brace/Splint: Unlocked hinge brace Restrictions Weight Bearing Restrictions: Yes LLE Weight Bearing: Non weight bearing Other Position/Activity Restrictions: Unrestricted L knee ROM in unlocked hinge brace      Mobility Bed Mobility Overal bed mobility: Needs Assistance Bed Mobility: Supine to Sit;Sit to Supine     Supine to sit: Min  assist;HOB elevated Sit to supine: Min assist;HOB elevated   General bed mobility comments: MinA to assist LLE out of and into bed, increased time, verbal cues for technique/sequencing   Transfers Overall transfer level: Needs assistance Equipment used: Rolling walker (2 wheeled) Transfers: Sit to/from Stand Sit to Stand: Min assist         General transfer comment: MinA to rise and steady; verbal cues for safe hand/RW placement; able to maintain NWB in LLE throughout     Balance Overall balance assessment: Needs assistance Sitting-balance support: No upper extremity supported;Feet unsupported;Feet supported Sitting balance-Leahy Scale: Good     Standing balance support: Bilateral upper extremity supported;During functional activity Standing balance-Leahy Scale: Poor Standing balance comment: Reliant on BUE support                           ADL either performed or assessed with clinical judgement   ADL Overall ADL's : Needs assistance/impaired Eating/Feeding: Set up;Sitting   Grooming: Set up;Sitting   Upper Body Bathing: Min guard;Sitting;Bed level   Lower Body Bathing: Moderate assistance;Sitting/lateral leans   Upper Body Dressing : Min guard;Sitting   Lower Body Dressing: Maximal assistance;Sitting/lateral leans;Sit to/from stand Lower Body Dressing Details (indicate cue type and reason): MaxA to doff mesh briefs seated EOB  Toilet Transfer: Minimal assistance;Ambulation;Comfort height toilet;Grab bars;RW Statistician Details (indicate cue type and reason): verbal cues for safe transfer technique  Toileting- Clothing Manipulation and Hygiene: Minimal assistance;Sit to/from stand Toileting - Clothing Manipulation Details (indicate cue type and reason): steady assist while Pt completes perihygiene after voiding bladder and assist for gown management      Functional mobility during  ADLs: Minimal assistance;Rolling walker General ADL Comments: Pt  completed room level functional mobility using RW with MinA throughout and intermittent verbal safety cues for RW use to transfer to toilet, complete toileting. Pt with good demonstration of maintaining NWB in LLE throughout mobility.                         Pertinent Vitals/Pain Pain Assessment: Faces Faces Pain Scale: Hurts little more Pain Location: L leg Pain Descriptors / Indicators: Grimacing;Guarding;Sore Pain Intervention(s): Limited activity within patient's tolerance;Monitored during session;Repositioned;Ice applied;Patient requesting pain meds-RN notified          Extremity/Trunk Assessment Upper Extremity Assessment Upper Extremity Assessment: Overall WFL for tasks assessed      Cervical / Trunk Assessment Cervical / Trunk Assessment: Normal   Communication Communication Communication: No difficulties   Cognition Arousal/Alertness: Awake/alert Behavior During Therapy: WFL for tasks assessed/performed Overall Cognitive Status: Within Functional Limits for tasks assessed                                                      Home Living Family/patient expects to be discharged to:: Private residence Living Arrangements: Alone Available Help at Discharge: Family;Friend(s);Available PRN/intermittently Type of Home: Apartment Home Access: Stairs to enter Entrance Stairs-Number of Steps: 25 Entrance Stairs-Rails: Right;Left Home Layout: One level     Bathroom Shower/Tub: Producer, television/film/video: Standard     Home Equipment: Crutches          Prior Functioning/Environment Level of Independence: Independent with assistive device(s)        Comments: Mod indep with crutches over past ~1 week; independent prior to this         OT Problem List: Decreased strength;Impaired balance (sitting and/or standing);Decreased knowledge of precautions;Decreased knowledge of use of DME or AE;Decreased activity tolerance;Pain       OT Treatment/Interventions: Self-care/ADL training;DME and/or AE instruction;Therapeutic activities;Balance training;Therapeutic exercise;Patient/family education;Energy conservation    OT Goals(Current goals can be found in the care plan section) Acute Rehab OT Goals Patient Stated Goal: Return home soon OT Goal Formulation: With patient Time For Goal Achievement: 03/19/17 Potential to Achieve Goals: Good  OT Frequency: Min 2X/week                             AM-PAC PT "6 Clicks" Daily Activity     Outcome Measure Help from another person eating meals?: None Help from another person taking care of personal grooming?: A Little Help from another person toileting, which includes using toliet, bedpan, or urinal?: A Lot Help from another person bathing (including washing, rinsing, drying)?: A Lot Help from another person to put on and taking off regular upper body clothing?: None Help from another person to put on and taking off regular lower body clothing?: Total 6 Click Score: 16   End of Session Equipment Utilized During Treatment: Gait belt;Rolling walker;Other (comment) (unlocked hinge brace ) Nurse Communication: Patient requests pain meds  Activity Tolerance: Patient tolerated treatment well Patient left: in bed;with call bell/phone within reach;with family/visitor present  OT Visit Diagnosis: Unsteadiness on feet (R26.81);Other abnormalities of gait and mobility (R26.89);Pain Pain - Right/Left: Left Pain - part of body: Knee  Time: 1420-1440 OT Time Calculation (min): 20 min Charges:  OT General Charges $OT Visit: 1 Visit OT Evaluation $OT Eval Moderate Complexity: 1 Mod G-Codes:     Marcy Siren, OT Pager [phone] 03/05/2017   Orlando Penner 03/05/2017, 3:44 PM

## 2017-03-05 NOTE — Evaluation (Addendum)
Physical Therapy Evaluation Patient Details Name: Alice [name] MRN: [id number] DOB: 1975/12/06 Today's Date: 03/05/2017   History of Present Illness  Pt is a 41 y.o. female admitted after falling down stairs and sustaining a L bicondylar tibial plateau fx and lateral meniscus tear. Now s/p tibial ORIF, ant compartment fasciotomy, and lat meniscus repair on 03/04/17. Pertinent PMH includes avascular necrosis of hip, ADHD, depression, bipolar disorder. Pertinent surgical hx includes ACL repair (1995), hip arthroscopy (2008, 2010), multiple shoulder sxs.      Clinical Impression  Pt presents with an overall decrease in functional mobility secondary to above. PTA, pt indep with all mobility; has been mod indep with crutches ~1 week at home since initial injury. Educ on precautions, positioning, and importance of mobility. Today, pt required minA for bed mobility, but transfer and amb with RW and min guard for balance. Pt seems to be self-limited secondary to increased anxiety and pain with movement. Pt declined therex secondary to fatigue, but provided HEP handout that PT will plan to review with her tomorrow. Has multiple stairs to enter home - will plan for stair training tomorrow if pt discharging. Pt would benefit from continued acute PT services to maximize functional mobility and independence prior to d/c with HHPT.     Follow Up Recommendations DC plan and follow up therapy as arranged by surgeon;Home health PT;Supervision for mobility/OOB    Equipment Recommendations  Rolling walker with 5" wheels    Recommendations for Other Services OT consult     Precautions / Restrictions Precautions Precautions: Knee Required Braces or Orthoses: Other Brace/Splint Other Brace/Splint: Unlocked hinge brace Restrictions Weight Bearing Restrictions: Yes LLE Weight Bearing: Non weight bearing Other Position/Activity Restrictions: Unrestricted L knee ROM in unlocked hinge brace      Mobility  Bed Mobility Overal bed mobility: Needs Assistance Bed Mobility: Supine to Sit;Sit to Supine     Supine to sit: Min assist;HOB elevated Sit to supine: Min assist;HOB elevated   General bed mobility comments: MinA to assist LLE out of and into bed; totalA to keep LLE elevated and max encouragement for pt to flex L knee.   Transfers Overall transfer level: Needs assistance Equipment used: Rolling walker (2 wheeled) Transfers: Sit to/from Stand Sit to Stand: Min guard         General transfer comment: Stood from chair and bed with RW and min guard for safety; cues for hand placement. Pt has great technique and is able to well maintain her LLE NWB precautions  Ambulation/Gait Ambulation/Gait assistance: Min guard Ambulation Distance (Feet): 45 Feet Assistive device: Rolling walker (2 wheeled) Gait Pattern/deviations: Antalgic Gait velocity: Decreased Gait velocity interpretation: <1.8 ft/sec, indicative of risk for recurrent falls General Gait Details: Amb in room with RW and min guard for safety, utilizing hop-to pattern; cues for technique with RW. 1x seated rest break secondary to fatigue and anxiety.   Stairs            Wheelchair Mobility    Modified Rankin (Stroke Patients Only)       Balance Overall balance assessment: Needs assistance Sitting-balance support: No upper extremity supported;Feet unsupported;Feet supported Sitting balance-Leahy Scale: Good     Standing balance support: Bilateral upper extremity supported;During functional activity Standing balance-Leahy Scale: Poor Standing balance comment: Reliant on BUE support                             Pertinent Vitals/Pain Pain Assessment: Faces Faces  Pain Scale: Hurts little more Pain Location: L leg Pain Intervention(s): Premedicated before session;Monitored during session;Limited activity within patient's tolerance;Repositioned    Home Living Family/patient expects to be discharged  to:: Private residence Living Arrangements: Alone Available Help at Discharge: Family;Friend(s);Available PRN/intermittently Type of Home: Apartment Home Access: Stairs to enter Entrance Stairs-Rails: Right;Left Entrance Stairs-Number of Steps: 25 Home Layout: One level Home Equipment: Crutches      Prior Function Level of Independence: Independent with assistive device(s)         Comments: Mod indep with crutches over past ~1 week     Hand Dominance        Extremity/Trunk Assessment   Upper Extremity Assessment Upper Extremity Assessment: Overall WFL for tasks assessed    Lower Extremity Assessment Lower Extremity Assessment: LLE deficits/detail LLE Deficits / Details: Requires BUE support to lift L hip; unwilling to bed L knee beyond 45' LLE: Unable to fully assess due to pain    Cervical / Trunk Assessment Cervical / Trunk Assessment: Normal  Communication   Communication: No difficulties  Cognition Arousal/Alertness: Awake/alert Behavior During Therapy: WFL for tasks assessed/performed Overall Cognitive Status: Within Functional Limits for tasks assessed                                        General Comments      Exercises     Assessment/Plan    PT Assessment Patient needs continued PT services  PT Problem List Decreased strength;Decreased range of motion;Decreased activity tolerance;Decreased balance;Decreased mobility;Decreased knowledge of use of DME;Decreased knowledge of precautions;Pain       PT Treatment Interventions DME instruction;Stair training;Functional mobility training;Therapeutic activities;Therapeutic exercise;Balance training;Gait training;Patient/family education    PT Goals (Current goals can be found in the Care Plan section)  Acute Rehab PT Goals Patient Stated Goal: Return home soon PT Goal Formulation: With patient Time For Goal Achievement: 03/19/17 Potential to Achieve Goals: Good    Frequency Min  5X/week   Barriers to discharge Decreased caregiver support;Inaccessible home environment      Co-evaluation               AM-PAC PT "6 Clicks" Daily Activity  Outcome Measure Difficulty turning over in bed (including adjusting bedclothes, sheets and blankets)?: Unable Difficulty moving from lying on back to sitting on the side of the bed? : Unable Difficulty sitting down on and standing up from a chair with arms (e.g., wheelchair, bedside commode, etc,.)?: A Little Help needed moving to and from a bed to chair (including a wheelchair)?: A Little Help needed walking in hospital room?: A Little Help needed climbing 3-5 steps with a railing? : A Lot 6 Click Score: 13    End of Session Equipment Utilized During Treatment: Gait belt Activity Tolerance: Patient limited by pain;Other (comment) (anxiety) Patient left: in bed;with call bell/phone within reach;with family/visitor present Nurse Communication: Mobility status PT Visit Diagnosis: Other abnormalities of gait and mobility (R26.89);Pain Pain - Right/Left: Left Pain - part of body: Leg    Time: 1610-9604 PT Time Calculation (min) (ACUTE ONLY): 31 min   Charges:   PT Evaluation $PT Eval Low Complexity: 1 Low PT Treatments $Gait Training: 8-22 mins   PT G Codes:       Ina Homes, PT, DPT Acute Rehab Services  Pager: [phone]  Malachy Chamber 03/05/2017, 1:58 PM

## 2017-03-05 NOTE — Progress Notes (Signed)
Orthopaedic Trauma Service (OTS)  1 Day Post-Op Procedure(s) (LRB): OPEN REDUCTION INTERNAL FIXATION (ORIF) TIBIAL PLATEAU (Left) ANTERIOR COMPARTMENT FASCIOTOMY (Left)  Subjective: Patient reports pain as 9 on 0-10 scale. Concerned that Perc 5 and oxy 5 break through is not going to do it given current pain level.  Went through [employer] together. Patient is a bridesmaid in a wedding next weekend.  Objective: Current Vitals Blood pressure 115/61, pulse 65, temperature 98.9 F (37.2 C), temperature source Oral, resp. rate 16, last menstrual period 02/28/2017, SpO2 100 %. Vital signs in last 24 hours: Temp:  [97.3 F (36.3 C)-99.9 F (37.7 C)] 98.9 F (37.2 C) (09/21 0700) Pulse Rate:  [63-88] 65 (09/21 0700) Resp:  [15-23] 16 (09/21 0700) BP: (110-134)/(61-84) 115/61 (09/21 0700) SpO2:  [97 %-100 %] 100 % (09/21 0700)  Intake/Output from previous day: 09/20 0701 - 09/21 0700 In: 1600 [P.O.:200; I.V.:1400] Out: 800 [Urine:700; Blood:100]  LABS  Recent Labs  03/04/17 0710 03/04/17 1348 03/05/17 0308  HGB 11.7* 11.1* 10.7*    Recent Labs  03/04/17 1348 03/05/17 0308  WBC 11.6* 12.9*  RBC 3.80* 3.74*  HCT 34.2* 34.2*  PLT 296 351    Recent Labs  03/04/17 0710 03/04/17 1348 03/05/17 0308  NA 136  --  139  K 4.0  --  4.8  CL 105  --  106  CO2 22  --  27  BUN 14  --  7  CREATININE 0.66 0.69 0.73  GLUCOSE 95  --  122*  CALCIUM 9.0  --  8.5*    Recent Labs  03/04/17 0710  INR 1.01     Physical Exam LLE Dressing intact, clean, dry  Edema/ swelling controlled  Sens: DPN, SPN, TN intact  Motor: EHL, FHL, and lessor toe ext and flex all intact grossly  Brisk cap refill, warm to touch In zero knee; brace being applied now by [employer] at [employer]  Assessment/Plan: 1 Day Post-Op Procedure(s) (LRB): OPEN REDUCTION INTERNAL FIXATION (ORIF) TIBIAL PLATEAU (Left) ANTERIOR COMPARTMENT FASCIOTOMY (Left) 1. PT/OT NWB on LLE 2. DVT proph Lovenox 3. F/u 8-14  days; probable d/c tomorrow  Myrene Galas, MD [facility] [phone] (p)

## 2017-03-06 ENCOUNTER — Encounter (HOSPITAL_COMMUNITY): Payer: Self-pay | Admitting: Orthopedic Surgery

## 2017-03-06 DIAGNOSIS — F17200 Nicotine dependence, unspecified, uncomplicated: Secondary | ICD-10-CM | POA: Diagnosis present

## 2017-03-06 DIAGNOSIS — F3181 Bipolar II disorder: Secondary | ICD-10-CM | POA: Diagnosis present

## 2017-03-06 DIAGNOSIS — Z87891 Personal history of nicotine dependence: Secondary | ICD-10-CM

## 2017-03-06 HISTORY — DX: Nicotine dependence, unspecified, uncomplicated: F17.200

## 2017-03-06 HISTORY — DX: Personal history of nicotine dependence: Z87.891

## 2017-03-06 LAB — CBC
HCT: 30.7 % — ABNORMAL LOW (ref 36.0–46.0)
Hemoglobin: 9.6 g/dL — ABNORMAL LOW (ref 12.0–15.0)
MCH: 28.7 pg (ref 26.0–34.0)
MCHC: 31.3 g/dL (ref 30.0–36.0)
MCV: 91.9 fL (ref 78.0–100.0)
PLATELETS: 327 10*3/uL (ref 150–400)
RBC: 3.34 MIL/uL — AB (ref 3.87–5.11)
RDW: 12.9 % (ref 11.5–15.5)
WBC: 9.2 10*3/uL (ref 4.0–10.5)

## 2017-03-06 LAB — BASIC METABOLIC PANEL
Anion gap: 5 (ref 5–15)
BUN: 6 mg/dL (ref 6–20)
CALCIUM: 8.2 mg/dL — AB (ref 8.9–10.3)
CO2: 25 mmol/L (ref 22–32)
CREATININE: 0.67 mg/dL (ref 0.44–1.00)
Chloride: 108 mmol/L (ref 101–111)
GFR calc Af Amer: >60 mL/min (ref >60)
GFR calc non Af Amer: >60 mL/min (ref >60)
GLUCOSE: 102 mg/dL — AB (ref 65–99)
Potassium: 4 mmol/L (ref 3.5–5.1)
SODIUM: 138 mmol/L (ref 135–145)

## 2017-03-06 MED ORDER — CHOLECALCIFEROL 125 MCG (5000 UT) PO CAPS: 5000.0000 [IU] | ORAL_CAPSULE | Freq: Every day | ORAL | 2 refills | Status: DC

## 2017-03-06 MED ORDER — POLYETHYLENE GLYCOL 3350 17 G PO PACK: 17.0000 g | PACK | Freq: Every day | ORAL | 0 refills | Status: DC

## 2017-03-06 MED ORDER — DOCUSATE SODIUM 100 MG PO CAPS: 100.0000 mg | ORAL_CAPSULE | Freq: Two times a day (BID) | ORAL | 0 refills | Status: DC

## 2017-03-06 MED ORDER — METHOCARBAMOL 500 MG PO TABS: 500.0000 mg | ORAL_TABLET | Freq: Four times a day (QID) | ORAL | 1 refills | Status: DC | PRN

## 2017-03-06 MED ORDER — OXYCODONE HCL 5 MG PO TABS: 5.0000 mg | ORAL_TABLET | Freq: Four times a day (QID) | ORAL | 0 refills | Status: DC | PRN

## 2017-03-06 MED ORDER — ENOXAPARIN SODIUM 40 MG/0.4ML ~~LOC~~ SOLN: 40.0000 mg | SUBCUTANEOUS | 0 refills | Status: DC

## 2017-03-06 MED ORDER — OXYCODONE-ACETAMINOPHEN 5-325 MG PO TABS: 1.0000 | ORAL_TABLET | Freq: Four times a day (QID) | ORAL | 0 refills | Status: DC | PRN

## 2017-03-06 NOTE — Progress Notes (Signed)
Orthopedic Trauma Service Progress Note   Patient ID: Alice [name] MRN: [id number] DOB/AGE: 39-Dec-1977 39 y.o.  Subjective:  Doing well Pain improved Got up 4 times yesterday  Completed stairs today   No other complaints or concerns    Review of Systems  Constitutional: Negative for chills and fever.  Respiratory: Negative for shortness of breath.   Cardiovascular: Negative for chest pain and palpitations.  Gastrointestinal: Negative for abdominal pain, nausea and vomiting.  Neurological: Negative for tingling and sensory change.    Objective:   VITALS:   Vitals:   03/05/17 0027 03/05/17 0700 03/05/17 2138 03/06/17 0526  BP: 118/72 115/61 (!) 114/56 114/62  Pulse: 63 65 68 79  Resp: Temp: 98.9 F (37.2 C) 98.9 F (37.2 C) 99.9 F (37.7 C) 99.7 F (37.6 C)  TempSrc: Oral Oral Oral Oral  SpO2: 100% 100% 98% 98%    Estimated body mass index is 22.85 kg/m as calculated from the following:   Height as of 02/23/17: 5' 3.25" (1.607 m).   Weight as of 02/23/17: 59 kg (130 lb).   Intake/Output      09/21 0701 - 09/22 0700 09/22 0701 - 09/23 0700   P.O. 480    I.V. 1200    Total Intake 1680     Urine 2850    Total Output 2850     Net -1170          Urine Occurrence 1 x 1 x     LABS  Results for orders placed or performed during the hospital encounter of 03/04/17 (from the past 24 hour(s))  Basic metabolic panel     Status: Abnormal   Collection Time: 03/06/17  5:27 AM  Result Value Ref Range   Sodium 138 135 - 145 mmol/L   Potassium 4.0 3.5 - 5.1 mmol/L   Chloride 108 101 - 111 mmol/L   CO2 25 22 - 32 mmol/L   Glucose, Bld 102 (H) 65 - 99 mg/dL   BUN 6 6 - 20 mg/dL   Creatinine, Ser 0.86 0.44 - 1.00 mg/dL   Calcium 8.2 (L) 8.9 - 10.3 mg/dL   GFR calc non Af Amer >60 >60 mL/min   GFR calc Af Amer >60 >60 mL/min   Anion gap 5 5 - 15  CBC     Status: Abnormal   Collection Time: 03/06/17  5:27 AM  Result Value Ref  Range   WBC 9.2 4.0 - 10.5 K/uL   RBC 3.34 (L) 3.87 - 5.11 MIL/uL   Hemoglobin 9.6 (L) 12.0 - 15.0 g/dL   HCT 57.8 (L) 46.9 - 62.9 %   MCV 91.9 78.0 - 100.0 fL   MCH 28.7 26.0 - 34.0 pg   MCHC 31.3 30.0 - 36.0 g/dL   RDW 52.8 41.3 - 24.4 %   Platelets 327 150 - 400 K/uL     PHYSICAL EXAM:   Gen: awake and alert, resting comfortably in bed, NAD Lungs: unlabored, clear Cardiac: RRR Abd: NT, + BS Ext:       Left Lower Extremity   Hinged knee brace fitting well  Dressing removed  Incision looks great, no signs of infection   Ext warm  Motor and sensory functions intact  No DCT   Compartments are soft     Assessment/Plan: 2 Days Post-Op   Principal Problem:   Closed bicondylar fracture of left tibial plateau Active Problems:   ADD (attention deficit disorder)   Bipolar II  disorder (HCC)   Nicotine dependence   Anti-infectives    Start     Dose/Rate Route Frequency Ordered Stop   03/04/17 1500  ceFAZolin (ANCEF) IVPB 1 g/50 mL premix     1 g 100 mL/hr over 30 Minutes Intravenous Every 6 hours 03/04/17 1335 03/05/17 0859   03/04/17 0645  ceFAZolin (ANCEF) IVPB 2g/100 mL premix     2 g 200 mL/hr over 30 Minutes Intravenous On call to O.R. 03/04/17 2130 03/04/17 0835   03/04/17 0644  ceFAZolin (ANCEF) 2-4 GM/100ML-% IVPB    Comments:  Domenica Fail   : cabinet override      03/04/17 0644 03/04/17 0830    .  POD/HD#: 2   -closed Left bicondylar tibial plateau fracture s/p ORIF  NWB L leg x 8 weeks  Unrestricted ROM L knee  Do not let knee rest in flexion, use pillows under ankle or bone foam to help maintain extension   Ice and elevate   TED hose   Dressing changes as needed  Ok to shower and clean wounds with soap and water     PT/OT    HEP for L knee ROM- AROM, PROM. Prone exercises as well. No ROM restrictions. Quad sets, SLR, LAQ, SAQ, heel slides, stretching, prone flexion and extension Heel cord stretching and ankle theraband program   - Pain  management:  Percocet and oxy IR  - ABL anemia/Hemodynamics  Robaxin   - Medical issues   Home meds  - DVT/PE prophylaxis:  lovenox x 21 days   - ID:   periop abx completed  - Metabolic Bone Disease:  Labs look ok  Recommend vitamin d3 5000 IUs daily at dc   - Activity:  NWB L leg  Unrestricted ROM L knee  - FEN/GI prophylaxis/Foley/Lines:  Reg diet  NSL IV   - Impediments to fracture healing:  Nicotine use   - Dispo:  Dc home this afternoon after PT  Follow up with ortho in 10-14 days     Mearl Latin, PA-C [facility] [phone] (P) [phone] (O) 03/06/2017, 9:07 AM

## 2017-03-06 NOTE — Progress Notes (Signed)
Physical Therapy Treatment Patient Details Name: Alice [name] MRN: [id number] DOB: 1975/11/19 Today's Date: 03/06/2017    History of Present Illness Pt is a 41 y.o. female admitted after falling down stairs and sustaining a L bicondylar tibial plateau fx and lateral meniscus tear. Now s/p tibial ORIF, ant compartment fasciotomy, and lat meniscus repair on 03/04/17. Pertinent PMH includes avascular necrosis of hip, ADHD, depression, bipolar disorder. Pertinent surgical hx includes ACL repair (1995), hip arthroscopy (2008, 2010), multiple shoulder sxs.      PT Comments    Pt made good progress with mobility today. Pt's mom was present throughout session. Pt is Mod Indep. With RW 182ft L NWB. Pt requires min assist with bed mobility to lift LLE. Pt completed step training using the bump up technique on her bottom with LLE in NWB. Pt demonstrated good technique and safety with mobility. Pt will have assistance at home and feel she is ready for d/c home when medically stable. Reviewed HEP and provided thara band for ankle exercises. Recommend d/c home and follow up HHPT.  Follow Up Recommendations  DC plan and follow up therapy as arranged by surgeon;Home health PT;Supervision for mobility/OOB     Equipment Recommendations  Rolling walker with 5" wheels    Recommendations for Other Services       Precautions / Restrictions Precautions Precautions: Knee Precaution Comments: reviewed no pillow under bend of knee  Required Braces or Orthoses: Other Brace/Splint Other Brace/Splint: Unlocked hinge brace Restrictions Weight Bearing Restrictions: Yes LLE Weight Bearing: Non weight bearing Other Position/Activity Restrictions: Unrestricted L knee ROM in unlocked hinge brace    Mobility  Bed Mobility Overal bed mobility: Needs Assistance Bed Mobility: Supine to Sit     Supine to sit: Min assist;HOB elevated Sit to supine: Min assist;HOB elevated   General bed mobility comments:  assistance with holding LLE  Transfers Overall transfer level: Modified independent Equipment used: Rolling walker (2 wheeled) Transfers: Sit to/from Stand Sit to Stand: Modified independent (Device/Increase time)         General transfer comment: LLE NWB  Ambulation/Gait Ambulation/Gait assistance: Modified independent (Device/Increase time) Ambulation Distance (Feet): 125 Feet Assistive device: Rolling walker (2 wheeled)   Gait velocity: Decreased       Stairs Stairs: Yes   Stair Management: No rails Number of Stairs: 2 General stair comments: bumped up steps-LLE NWB  Wheelchair Mobility    Modified Rankin (Stroke Patients Only)       Balance Overall balance assessment: Needs assistance Sitting-balance support: No upper extremity supported Sitting balance-Leahy Scale: Good     Standing balance support: Bilateral upper extremity supported Standing balance-Leahy Scale: Fair                              Cognition Arousal/Alertness: Awake/alert Behavior During Therapy: WFL for tasks assessed/performed Overall Cognitive Status: Within Functional Limits for tasks assessed                                        Exercises Total Joint Exercises Knee Flexion: AAROM;Strengthening;Left;10 reps;Seated Goniometric ROM: 0-20* General Exercises - Lower Extremity Ankle Circles/Pumps: PROM;AROM;Strengthening;Left;10 reps;Supine Quad Sets: AROM;Strengthening;Left;10 reps;Supine Heel Slides: AAROM;Strengthening;Left;10 reps;Supine Hip ABduction/ADduction: AAROM;Strengthening;Left;10 reps;Supine Straight Leg Raises: AAROM;Strengthening;Left;10 reps;Supine Other Exercises Other Exercises: ankle DF/PF PROMX10 (LLE) Other Exercises: ankle IV/EV PROM x10 (LLE) Other Exercises: theraband IV/EV/DF/PF x 10 (  LLE)    General Comments        Pertinent Vitals/Pain Pain Assessment: 0-10 Pain Score: 6  Pain Location: L leg Pain Descriptors /  Indicators: Discomfort Pain Intervention(s): Limited activity within patient's tolerance;Monitored during session;Premedicated before session;Repositioned    Home Living                      Prior Function            PT Goals (current goals can now be found in the care plan section) Progress towards PT goals: Progressing toward goals    Frequency    Min 5X/week      PT Plan Current plan remains appropriate    Co-evaluation              AM-PAC PT "6 Clicks" Daily Activity  Outcome Measure  Difficulty turning over in bed (including adjusting bedclothes, sheets and blankets)?: A Little Difficulty moving from lying on back to sitting on the side of the bed? : A Little Difficulty sitting down on and standing up from a chair with arms (e.g., wheelchair, bedside commode, etc,.)?: A Little Help needed moving to and from a bed to chair (including a wheelchair)?: A Little Help needed walking in hospital room?: A Little Help needed climbing 3-5 steps with a railing? : A Little 6 Click Score: 18    End of Session Equipment Utilized During Treatment: Gait belt Activity Tolerance: Patient tolerated treatment well Patient left: in bed;with call bell/phone within reach Nurse Communication: Mobility status PT Visit Diagnosis: Other abnormalities of gait and mobility (R26.89) Pain - Right/Left: Left Pain - part of body: Leg     Time: 9629-5284 PT Time Calculation (min) (ACUTE ONLY): 39 min  Charges:  $Gait Training: 8-22 mins $Therapeutic Exercise: 8-22 mins $Therapeutic Activity: 8-22 mins                    G Codes:       Lilyan Punt, PT   Greggory Stallion 03/06/2017, 10:48 AM

## 2017-03-06 NOTE — Progress Notes (Signed)
OT Note - Addendum    03/06/17 1541  OT Visit Information  Last OT Received On 03/06/17  OT Time Calculation  OT Start Time (ACUTE ONLY) 1440  OT Stop Time (ACUTE ONLY) 1457  OT Time Calculation (min) 17 min  OT Treatments  $Self Care/Home Management  8-22 mins  [facility], OT/L  [phone] 03/06/2017

## 2017-03-06 NOTE — Therapy (Signed)
Occupational Therapy Evaluation and Discharge Patient Details Name: Alice [name] MRN: [id number] DOB: 1975-09-23 Today's Date: 03/06/2017    History of Present Illness Pt is a 41 y.o. female admitted after falling down stairs and sustaining a L bicondylar tibial plateau fx and lateral meniscus tear. Now s/p tibial ORIF, ant compartment fasciotomy, and lat meniscus repair on 03/04/17. Pertinent PMH includes avascular necrosis of hip, ADHD, depression, bipolar disorder. Pertinent surgical hx includes ACL repair (1995), hip arthroscopy (2008, 2010), multiple shoulder sxs.     Clinical Impression   Focus of today's treatment session on shower transfer and LB ADLs with AE to increase independence. Pt able to complete shower transfer with crutches  and 3in1 with supervision. Pt required min assist to don LB clothing over left foot. Pt safe to d/c home. No further acute OT needs.     Follow Up Recommendations  DC plan and follow up therapy as arranged by surgeon;Home health OT;Supervision/Assistance - 24 hour    Equipment Recommendations  3 in 1 bedside commode    Recommendations for Other Services       Precautions / Restrictions Precautions Precautions: Knee Precaution Comments: reviewed no pillow under bend of knee  Required Braces or Orthoses: Other Brace/Splint Other Brace/Splint: Unlocked hinge brace Restrictions Weight Bearing Restrictions: Yes LLE Weight Bearing: Non weight bearing Other Position/Activity Restrictions: Unrestricted L knee ROM in unlocked hinge brace      Mobility Bed Mobility Overal bed mobility: Needs Assistance Bed Mobility: Supine to Sit     Supine to sit: Min assist;HOB elevated Sit to supine: Min assist;HOB elevated   General bed mobility comments: assistance with holding LLE  Transfers Overall transfer level: Needs assistance Equipment used: Rolling walker (2 wheeled) Transfers: Sit to/from Stand Sit to Stand: Supervision               Balance Overall balance assessment: Needs assistance Sitting-balance support: No upper extremity supported Sitting balance-Leahy Scale: Good     Standing balance support: Single extremity supported;During functional activity Standing balance-Leahy Scale: Fair Standing balance comment: Pt able to don LB clothing after peri care with one UE supported with no LOB.                            ADL either performed or assessed with clinical judgement   ADL Overall ADL's : Needs assistance/impaired             Lower Body Bathing: Minimal assistance;With adaptive equipment;Sitting/lateral leans Lower Body Bathing Details (indicate cue type and reason): Pt educated on use of long handle sponge to increase independence with LB bathing.      Lower Body Dressing: Minimal assistance;Sitting/lateral leans;With adaptive equipment Lower Body Dressing Details (indicate cue type and reason): Pt able to don compression socks with min assist to get sock over ankle. Pt able to don socks the rest of the way. Pt able to use sock aide to don socks independently. Pt educated on use of  reacher to increase independence with LB dressing.  Toilet Transfer: Supervision/safety;Ambulation;RW;BSC       Tub/ Shower Transfer: Supervision/safety;Set up;Ambulation;Shower seat (Crutches ) Tub/Shower Transfer Details (indicate cue type and reason): Simulated walk in shower transfer. Pt unable to fit walker into shower but can fit crutches. Pt able to hop over simulate threshold, turn, and sit on shower chair with no physical assist.  Functional mobility during ADLs: Supervision/safety;Set up;Rolling walker General ADL Comments: Pt reports someone will be  able to assist with LB ADLs as needed upon d/c.      Vision         Perception     Praxis      Pertinent Vitals/Pain Pain Assessment: Faces Faces Pain Scale: Hurts little more Pain Location: L leg Pain Descriptors / Indicators: Discomfort Pain  Intervention(s): Monitored during session     Hand Dominance     Extremity/Trunk Assessment             Communication     Cognition Arousal/Alertness: Awake/alert Behavior During Therapy: WFL for tasks assessed/performed Overall Cognitive Status: Within Functional Limits for tasks assessed                                     General Comments  Family present during treatment session. Pt has 3 cats and is concerned about cleaning their litter box. Discussed safe options to take care of pets.     Exercises     Shoulder Instructions      Home Living                                          Prior Functioning/Environment                   OT Problem List:        OT Treatment/Interventions:      OT Goals(Current goals can be found in the care plan section) Acute Rehab OT Goals Patient Stated Goal: To go home  OT Goal Formulation: With patient Time For Goal Achievement: 03/19/17 Potential to Achieve Goals: Good  OT Frequency: Min 2X/week   Barriers to D/C:            Co-evaluation              AM-PAC PT "6 Clicks" Daily Activity     Outcome Measure Help from another person eating meals?: None Help from another person taking care of personal grooming?: A Little Help from another person toileting, which includes using toliet, bedpan, or urinal?: A Little Help from another person bathing (including washing, rinsing, drying)?: A Little Help from another person to put on and taking off regular upper body clothing?: None Help from another person to put on and taking off regular lower body clothing?: A Little 6 Click Score: 20   End of Session Equipment Utilized During Treatment: Gait belt;Rolling walker;Other (comment) Nurse Communication: Mobility status  Activity Tolerance: Patient tolerated treatment well Patient left: in bed;with call bell/phone within reach;with family/visitor present  OT Visit Diagnosis:  Unsteadiness on feet (R26.81);Other abnormalities of gait and mobility (R26.89);Pain Pain - Right/Left: Left Pain - part of body: Knee                Time: 1440-1457 OT Time Calculation (min): 17 min Charges:    G-Codes:     Cammy Copa, OTS [phone]   Cammy Copa 03/06/2017, 3:16 PM

## 2017-03-06 NOTE — Discharge Instructions (Signed)
Orthopaedic Trauma Service Discharge Instructions   General Discharge Instructions  WEIGHT BEARING STATUS: nonweightbearing Left leg   RANGE OF MOTION/ACTIVITY: unrestricted Range of motion Left knee. Ok to be out of brace for range of motion exercises. Wear brace when up moving around.   Home exercise program (HEP) as reviewed with you by therapy. Do 3-4 x/day   HEP for L knee ROM- AROM, PROM. Prone exercises as well. No ROM restrictions. Quad sets, SLR, LAQ, SAQ, heel slides, stretching, prone flexion and extension Heel cord stretching and ankle theraband program   No pillows under bend of knee when at rest, ok to place under ankle to help work on knee extension, can use bone foam as well   Wound Care: daily dressing changes starting day after you get home. See below  Discharge Wound Care Instructions  Do NOT apply any ointments, solutions or lotions to pin sites or surgical wounds.  These prevent needed drainage and even though solutions like hydrogen peroxide kill bacteria, they also damage cells lining the pin sites that help fight infection.  Applying lotions or ointments can keep the wounds moist and can cause them to breakdown and open up as well. This can increase the risk for infection. When in doubt call the office.  Surgical incisions should be dressed daily.  If any drainage is noted, use one layer of adaptic, then gauze, Kerlix, and an ace wrap.  Once the incision is completely dry and without drainage, it may be left open to air out.  Showering may begin 36-48 hours later.  Cleaning gently with soap and water.  Traumatic wounds should be dressed daily as well.    One layer of adaptic, gauze, Kerlix, then ace wrap.  The adaptic can be discontinued once the draining has ceased    If you have a wet to dry dressing: wet the gauze with saline the squeeze as much saline out so the gauze is moist (not soaking wet), place moistened gauze over wound, then place a dry gauze over  the moist one, followed by Kerlix wrap, then ace wrap.  DVT/PE prophylaxis: Lovenox x 21 days   Diet: as you were eating previously.  Can use over the counter stool softeners and bowel preparations, such as Miralax, to help with bowel movements.  Narcotics can be constipating.  Be sure to drink plenty of fluids  PAIN MEDICATION USE AND EXPECTATIONS  You have likely been given narcotic medications to help control your pain.  After a traumatic event that results in an fracture (broken bone) with or without surgery, it is ok to use narcotic pain medications to help control one's pain.  We understand that everyone responds to pain differently and each individual patient will be evaluated on a regular basis for the continued need for narcotic medications. Ideally, narcotic medication use should last no more than 6-8 weeks (coinciding with fracture healing).   As a patient it is your responsibility as well to monitor narcotic medication use and report the amount and frequency you use these medications when you come to your office visit.   We would also advise that if you are using narcotic medications, you should take a dose prior to therapy to maximize you participation.  IF YOU ARE ON NARCOTIC MEDICATIONS IT IS NOT PERMISSIBLE TO OPERATE A MOTOR VEHICLE (MOTORCYCLE/CAR/TRUCK/MOPED) OR HEAVY MACHINERY DO NOT MIX NARCOTICS WITH OTHER CNS (CENTRAL NERVOUS SYSTEM) DEPRESSANTS SUCH AS ALCOHOL   STOP SMOKING OR USING NICOTINE PRODUCTS!!!!  As discussed nicotine  severely impairs your body's ability to heal surgical and traumatic wounds but also impairs bone healing.  Wounds and bone heal by forming microscopic blood vessels (angiogenesis) and nicotine is a vasoconstrictor (essentially, shrinks blood vessels).  Therefore, if vasoconstriction occurs to these microscopic blood vessels they essentially disappear and are unable to deliver necessary nutrients to the healing tissue.  This is one modifiable factor that  you can do to dramatically increase your chances of healing your injury.    (This means no smoking, no nicotine gum, patches, etc)  DO NOT USE NONSTEROIDAL ANTI-INFLAMMATORY DRUGS (NSAID'S)  Using products such as Advil (ibuprofen), Aleve (naproxen), Motrin (ibuprofen) for additional pain control during fracture healing can delay and/or prevent the healing response.  If you would like to take over the counter (OTC) medication, Tylenol (acetaminophen) is ok.  However, some narcotic medications that are given for pain control contain acetaminophen as well. Therefore, you should not exceed more than 4000 mg of tylenol in a day if you do not have liver disease.  Also note that there are may OTC medicines, such as cold medicines and allergy medicines that my contain tylenol as well.  If you have any questions about medications and/or interactions please ask your doctor/PA or your pharmacist.      ICE AND ELEVATE INJURED/OPERATIVE EXTREMITY  Using ice and elevating the injured extremity above your heart can help with swelling and pain control.  Icing in a pulsatile fashion, such as 20 minutes on and 20 minutes off, can be followed.    Do not place ice directly on skin. Make sure there is a barrier between to skin and the ice pack.    Using frozen items such as frozen peas works well as the conform nicely to the are that needs to be iced.  USE AN ACE WRAP OR TED HOSE FOR SWELLING CONTROL  In addition to icing and elevation, Ace wraps or TED hose are used to help limit and resolve swelling.  It is recommended to use Ace wraps or TED hose until you are informed to stop.    When using Ace Wraps start the wrapping distally (farthest away from the body) and wrap proximally (closer to the body)   Example: If you had surgery on your leg or thing and you do not have a splint on, start the ace wrap at the toes and work your way up to the thigh        If you had surgery on your upper extremity and do not have a  splint on, start the ace wrap at your fingers and work your way up to the upper arm  IF YOU ARE IN A SPLINT OR CAST DO NOT REMOVE IT FOR ANY REASON   If your splint gets wet for any reason please contact the office immediately. You may shower in your splint or cast as long as you keep it dry.  This can be done by wrapping in a cast cover or garbage back (or similar)  Do Not stick any thing down your splint or cast such as pencils, money, or hangers to try and scratch yourself with.  If you feel itchy take benadryl as prescribed on the bottle for itching  IF YOU ARE IN A CAM BOOT (BLACK BOOT)  You may remove boot periodically. Perform daily dressing changes as noted below.  Wash the liner of the boot regularly and wear a sock when wearing the boot. It is recommended that you sleep in the  boot until told otherwise  CALL THE OFFICE WITH ANY QUESTIONS OR CONCERNS: [phone]

## 2017-03-06 NOTE — Plan of Care (Signed)
Problem: Pain Managment: Goal: General experience of comfort will improve Patient voices understanding of pain scale and calls for medication when needed.   

## 2017-03-06 NOTE — Discharge Summary (Signed)
Orthopaedic Trauma Service (OTS)  Patient ID: Alice [name] MRN: [id number] DOB/AGE: 1975-11-10 41 y.o.  Admit date: 03/04/2017 Discharge date: 03/06/2017  Admission Diagnoses: Closed bicondylar tibial plateau fracture Bipolar disorder Nicotine dependence ADD  Discharge Diagnoses:  Principal Problem:   Closed bicondylar fracture of left tibial plateau Active Problems:   ADD (attention deficit disorder)   Bipolar II disorder (HCC)   Nicotine dependence   Procedures Performed: 03/04/2017- Dr. Carola Frost . OPEN REDUCTION INTERNAL FIXATION (ORIF) TIBIAL PLATEAU (Left) 2. ANTERIOR COMPARTMENT FASCIOTOMY (Left) 3. LATERAL MENISCUS REPAIR THROUGH ARTHROTOMY   Discharged Condition: good  Hospital Course:    40 year old female sustained a left bicondylar tibial plateau fracture after falling down some stairs. Patient initially seen at a local hospital. She was referred to orthopedic trauma specialists by the on-call orthopedic surgeon. Patient was seen and evaluated in the office. Surgery was deemed to be appropriate and necessary. Patient taken to the OR on 03/04/2017 for the procedure noted above. After surgery patient was transferred to the PACU for recovery from anesthesia and then transferred to the orthopedic protocol for observation, pain control and therapies. Patient tolerated procedure very well. Patient did not have any acute issues during her hospital stay. Her hospital stay was as anticipated. She progressed well with physical therapy starting on postoperative day #1. She was covered with Lovenox for DVT and PE prophylaxis starting on postop day 1 as well. She was also on Lovenox preadmission. Patient was covered with Ancef for routine surgical prophylaxis. On postoperative day #2 patient was doing very well. She was able to tolerate stairs with physical therapy. She was able to angulate about 125 feet with walker. No other issues were noted. She was tolerating a diet and  voiding without difficulty.Patient was deemed to be stable for discharge to home on postoperative day #2. Dressing was changed on postop day 2. Her incisions look fantastic no signs of infection. Wound care was reviewed with the patient. She'll follow-up at our office in 10-14 days   We did conduct a metabolic bone workup which was unremarkable. Her vitamin D was low normal and as such I would recommend supplementation with 5000 IUs of vitamin D3 daily given her age.  Consults: None  Significant Diagnostic Studies: labs:   Results for Alice [name], Alice [name] (MRN [id number]) as of 03/06/2017 14:28  Ref. Range 03/04/2017 07:10  Sodium Latest Ref Range: 135 - 145 mmol/L 136  Potassium Latest Ref Range: 3.5 - 5.1 mmol/L 4.0  Chloride Latest Ref Range: 101 - 111 mmol/L 105  CO2 Latest Ref Range: 22 - 32 mmol/L 22  Glucose Latest Ref Range: 65 - 99 mg/dL 95  BUN Latest Ref Range: 6 - 20 mg/dL 14  Creatinine Latest Ref Range: 0.44 - 1.00 mg/dL 4.40  Calcium Latest Ref Range: 8.9 - 10.3 mg/dL 9.0  Anion gap Latest Ref Range: 5 - 15  9  Calcium, Ionized, Serum Latest Ref Range: 4.5 - 5.6 mg/dL 5.1  Phosphorus Latest Ref Range: 2.5 - 4.6 mg/dL 2.9  Magnesium Latest Ref Range: 1.7 - 2.4 mg/dL 2.0  Alkaline Phosphatase Latest Ref Range: 38 - 126 U/L 77  Albumin Latest Ref Range: 3.5 - 5.0 g/dL 3.8  AST Latest Ref Range: 15 - 41 U/L 17  ALT Latest Ref Range: 14 - 54 U/L 16  Total Protein Latest Ref Range: 6.5 - 8.1 g/dL 6.9  Total Bilirubin Latest Ref Range: 0.3 - 1.2 mg/dL 0.2 (L)  PREALBUMIN Latest Ref Range: 18 - 38  mg/dL 16.1  GFR, Est Non African American Latest Ref Range: >60 mL/min >60  GFR, Est African American Latest Ref Range: >60 mL/min >60  CRP Latest Ref Range: <1.0 mg/dL 3.0 (H)  Vit D, 0,96-EAVWUJWJX Latest Ref Range: 19.9 - 79.3 pg/mL 25.6  Vitamin D, 25-Hydroxy Latest Ref Range: 30.0 - 100.0 ng/mL 32.9  WBC Latest Ref Range: 4.0 - 10.5 K/uL 7.7  RBC Latest Ref Range: 3.87 - 5.11  MIL/uL 3.97  Hemoglobin Latest Ref Range: 12.0 - 15.0 g/dL 91.4 (L)  HCT Latest Ref Range: 36.0 - 46.0 % 36.0  MCV Latest Ref Range: 78.0 - 100.0 fL 90.7  MCH Latest Ref Range: 26.0 - 34.0 pg 29.5  MCHC Latest Ref Range: 30.0 - 36.0 g/dL 78.2  RDW Latest Ref Range: 11.5 - 15.5 % 12.6  Platelets Latest Ref Range: 150 - 400 K/uL 258  Neutrophils Latest Units: % 60  Lymphocytes Latest Units: % 28  Monocytes Relative Latest Units: % 7  Eosinophil Latest Units: % 4  Basophil Latest Units: % 1  NEUT# Latest Ref Range: 1.7 - 7.7 K/uL 4.7  Lymphocyte # Latest Ref Range: 0.7 - 4.0 K/uL 2.1  Monocyte # Latest Ref Range: 0.1 - 1.0 K/uL 0.5  Eosinophils Absolute Latest Ref Range: 0.0 - 0.7 K/uL 0.3  Basophils Absolute Latest Ref Range: 0.0 - 0.1 K/uL 0.1  Sed Rate Latest Ref Range: 0 - 22 mm/hr 27 (H)  Prothrombin Time Latest Ref Range: 11.4 - 15.2 seconds 13.2  INR Unknown 1.01  APTT Latest Ref Range: 24 - 36 seconds 34  Preg, Serum Latest Ref Range: NEGATIVE  NEGATIVE  PTH, Intact Latest Ref Range: 15 - 65 pg/mL 30  Calcium, Total (PTH) Latest Ref Range: 8.7 - 10.2 mg/dL 9.2  PTH Interp Unknown Comment  Results for Alice [name], Alice [name] (MRN [id number]) as of 03/06/2017 14:28  Ref. Range 03/04/2017 07:13  Hemoglobin A1C Latest Ref Range: 4.8 - 5.6 % 5.0  TSH Latest Ref Range: 0.350 - 4.500 uIU/mL 2.039   Results for Alice [name], Alice [name] (MRN [id number]) as of 03/06/2017 14:28  Ref. Range 03/06/2017 05:27  WBC Latest Ref Range: 4.0 - 10.5 K/uL 9.2  RBC Latest Ref Range: 3.87 - 5.11 MIL/uL 3.34 (L)  Hemoglobin Latest Ref Range: 12.0 - 15.0 g/dL 9.6 (L)  HCT Latest Ref Range: 36.0 - 46.0 % 30.7 (L)  MCV Latest Ref Range: 78.0 - 100.0 fL 91.9  MCH Latest Ref Range: 26.0 - 34.0 pg 28.7  MCHC Latest Ref Range: 30.0 - 36.0 g/dL 52.8  RDW Latest Ref Range: 11.5 - 15.5 % 12.9  Platelets Latest Ref Range: 150 - 400 K/uL 327    Treatments: IV hydration, antibiotics: Ancef, analgesia: Percocet, OxyIR,  Dilaudid, anticoagulation: LMW heparin, therapies: PT, OT and RN and surgery: as above  Discharge Exam:   Orthopedic Trauma Service Progress Note    Patient ID: Alice [name] MRN: [id number] DOB/AGE: 29-Dec-1975 41 y.o.   Subjective:   Doing well Pain improved Got up 4 times yesterday  Completed stairs today    No other complaints or concerns      Review of Systems  Constitutional: Negative for chills and fever.  Respiratory: Negative for shortness of breath.   Cardiovascular: Negative for chest pain and palpitations.  Gastrointestinal: Negative for abdominal pain, nausea and vomiting.  Neurological: Negative for tingling and sensory change.      Objective:    VITALS:         Vitals:  03/05/17 0027 03/05/17 0700 03/05/17 2138 03/06/17 0526  BP: 118/72 115/61 (!) 114/56 114/62  Pulse: 63 65 68 79  Resp: 16 16 19 20   Temp: 98.9 F (37.2 C) 98.9 F (37.2 C) 99.9 F (37.7 C) 99.7 F (37.6 C)  TempSrc: Oral Oral Oral Oral  SpO2: 100% 100% 98% 98%      Estimated body mass index is 22.85 kg/m as calculated from the following:   Height as of 02/23/17: 5' 3.25" (1.607 m).   Weight as of 02/23/17: 59 kg (130 lb).     Intake/Output      09/21 0701 - 09/22 0700 09/22 0701 - 09/23 0700   P.O. 480    I.V. 1200    Total Intake 1680     Urine 2850    Total Output 2850     Net -1170          Urine Occurrence 1 x 1 x      LABS   Lab Results Last 24 Hours       Results for orders placed or performed during the hospital encounter of 03/04/17 (from the past 24 hour(s))  Basic metabolic panel     Status: Abnormal    Collection Time: 03/06/17  5:27 AM  Result Value Ref Range    Sodium 138 135 - 145 mmol/L    Potassium 4.0 3.5 - 5.1 mmol/L    Chloride 108 101 - 111 mmol/L    CO2 25 22 - 32 mmol/L    Glucose, Bld 102 (H) 65 - 99 mg/dL    BUN 6 6 - 20 mg/dL    Creatinine, Ser 1.61 0.44 - 1.00 mg/dL    Calcium 8.2 (L) 8.9 - 10.3 mg/dL    GFR calc non Af Amer >60  >60 mL/min    GFR calc Af Amer >60 >60 mL/min    Anion gap 5 5 - 15  CBC     Status: Abnormal    Collection Time: 03/06/17  5:27 AM  Result Value Ref Range    WBC 9.2 4.0 - 10.5 K/uL    RBC 3.34 (L) 3.87 - 5.11 MIL/uL    Hemoglobin 9.6 (L) 12.0 - 15.0 g/dL    HCT 09.6 (L) 04.5 - 46.0 %    MCV 91.9 78.0 - 100.0 fL    MCH 28.7 26.0 - 34.0 pg    MCHC 31.3 30.0 - 36.0 g/dL    RDW 40.9 81.1 - 91.4 %    Platelets 327 150 - 400 K/uL          PHYSICAL EXAM:    Gen: awake and alert, resting comfortably in bed, NAD Lungs: unlabored, clear Cardiac: RRR Abd: NT, + BS Ext:       Left Lower Extremity              Hinged knee brace fitting well             Dressing removed             Incision looks great, no signs of infection              Ext warm             Motor and sensory functions intact             No DCT              Compartments are soft  Assessment/Plan: 2 Days Post-Op    Principal Problem:   Closed bicondylar fracture of left tibial plateau Active Problems:   ADD (attention deficit disorder)   Bipolar II disorder (HCC)   Nicotine dependence               Anti-infectives     Start     Dose/Rate Route Frequency Ordered Stop    03/04/17 1500   ceFAZolin (ANCEF) IVPB 1 g/50 mL premix     1 g 100 mL/hr over 30 Minutes Intravenous Every 6 hours 03/04/17 1335 03/05/17 0859    03/04/17 0645   ceFAZolin (ANCEF) IVPB 2g/100 mL premix     2 g 200 mL/hr over 30 Minutes Intravenous On call to O.R. 03/04/17 1610 03/04/17 0835    03/04/17 0644   ceFAZolin (ANCEF) 2-4 GM/100ML-% IVPB    Comments:  Domenica Fail   : cabinet override         03/04/17 0644 03/04/17 0830     .   POD/HD#: 2     -closed Left bicondylar tibial plateau fracture s/p ORIF             NWB L leg x 8 weeks             Unrestricted ROM L knee             Do not let knee rest in flexion, use pillows under ankle or bone foam to help maintain extension              Ice and elevate               TED hose              Dressing changes as needed             Ok to shower and clean wounds with soap and water                           PT/OT                          HEP for L knee ROM- AROM, PROM. Prone exercises as well. No ROM restrictions.  Quad sets, SLR, LAQ, SAQ, heel slides, stretching, prone flexion and extension Heel cord stretching and ankle theraband program   - Pain management:             Percocet and oxy IR   - ABL anemia/Hemodynamics             Robaxin    - Medical issues              Home meds   - DVT/PE prophylaxis:             lovenox x 21 days    - ID:              periop abx completed   - Metabolic Bone Disease:             Labs look ok             Recommend vitamin d3 5000 IUs daily at dc    - Activity:             NWB L leg             Unrestricted ROM L knee   - FEN/GI prophylaxis/Foley/Lines:  Reg diet             NSL IV     - Impediments to fracture healing:             Nicotine use              - Dispo:             Dc home this afternoon after PT             Follow up with ortho in 10-14 days      Disposition: 01-Home or Self Care  Discharge Instructions    Call MD / Call 911    Complete by:  As directed    If you experience chest pain or shortness of breath, CALL 911 and be transported to the hospital emergency room.  If you develope a fever above 101 F, pus (white drainage) or increased drainage or redness at the wound, or calf pain, call your surgeon's office.   Constipation Prevention    Complete by:  As directed    Drink plenty of fluids.  Prune juice may be helpful.  You may use a stool softener, such as Colace (over the counter) 100 mg twice a day.  Use MiraLax (over the counter) for constipation as needed.   Diet general    Complete by:  As directed    Discharge instructions    Complete by:  As directed    Orthopaedic Trauma Service Discharge Instructions   General Discharge Instructions  WEIGHT  BEARING STATUS: nonweightbearing Left leg   RANGE OF MOTION/ACTIVITY: unrestricted Range of motion Left knee. Ok to be out of brace for range of motion exercises. Wear brace when up moving around.   Home exercise program (HEP) as reviewed with you by therapy. Do 3-4 x/day   HEP for L knee ROM- AROM, PROM. Prone exercises as well. No ROM restrictions. Quad sets, SLR, LAQ, SAQ, heel slides, stretching, prone flexion and extension Heel cord stretching and ankle theraband program   No pillows under bend of knee when at rest, ok to place under ankle to help work on knee extension, can use bone foam as well   Wound Care: daily dressing changes starting day after you get home. See below  Discharge Wound Care Instructions  Do NOT apply any ointments, solutions or lotions to pin sites or surgical wounds.  These prevent needed drainage and even though solutions like hydrogen peroxide kill bacteria, they also damage cells lining the pin sites that help fight infection.  Applying lotions or ointments can keep the wounds moist and can cause them to breakdown and open up as well. This can increase the risk for infection. When in doubt call the office.  Surgical incisions should be dressed daily.  If any drainage is noted, use one layer of adaptic, then gauze, Kerlix, and an ace wrap.  Once the incision is completely dry and without drainage, it may be left open to air out.  Showering may begin 36-48 hours later.  Cleaning gently with soap and water.  Traumatic wounds should be dressed daily as well.    One layer of adaptic, gauze, Kerlix, then ace wrap.  The adaptic can be discontinued once the draining has ceased    If you have a wet to dry dressing: wet the gauze with saline the squeeze as much saline out so the gauze is moist (not soaking wet), place moistened gauze over wound, then place a dry gauze over the  moist one, followed by Kerlix wrap, then ace wrap.  DVT/PE prophylaxis: Lovenox x 21 days    Diet: as you were eating previously.  Can use over the counter stool softeners and bowel preparations, such as Miralax, to help with bowel movements.  Narcotics can be constipating.  Be sure to drink plenty of fluids  PAIN MEDICATION USE AND EXPECTATIONS  You have likely been given narcotic medications to help control your pain.  After a traumatic event that results in an fracture (broken bone) with or without surgery, it is ok to use narcotic pain medications to help control one's pain.  We understand that everyone responds to pain differently and each individual patient will be evaluated on a regular basis for the continued need for narcotic medications. Ideally, narcotic medication use should last no more than 6-8 weeks (coinciding with fracture healing).   As a patient it is your responsibility as well to monitor narcotic medication use and report the amount and frequency you use these medications when you come to your office visit.   We would also advise that if you are using narcotic medications, you should take a dose prior to therapy to maximize you participation.  IF YOU ARE ON NARCOTIC MEDICATIONS IT IS NOT PERMISSIBLE TO OPERATE A MOTOR VEHICLE (MOTORCYCLE/CAR/TRUCK/MOPED) OR HEAVY MACHINERY DO NOT MIX NARCOTICS WITH OTHER CNS (CENTRAL NERVOUS SYSTEM) DEPRESSANTS SUCH AS ALCOHOL   STOP SMOKING OR USING NICOTINE PRODUCTS!!!!  As discussed nicotine severely impairs your body's ability to heal surgical and traumatic wounds but also impairs bone healing.  Wounds and bone heal by forming microscopic blood vessels (angiogenesis) and nicotine is a vasoconstrictor (essentially, shrinks blood vessels).  Therefore, if vasoconstriction occurs to these microscopic blood vessels they essentially disappear and are unable to deliver necessary nutrients to the healing tissue.  This is one modifiable factor that you can do to dramatically increase your chances of healing your injury.    (This means no  smoking, no nicotine gum, patches, etc)  DO NOT USE NONSTEROIDAL ANTI-INFLAMMATORY DRUGS (NSAID'S)  Using products such as Advil (ibuprofen), Aleve (naproxen), Motrin (ibuprofen) for additional pain control during fracture healing can delay and/or prevent the healing response.  If you would like to take over the counter (OTC) medication, Tylenol (acetaminophen) is ok.  However, some narcotic medications that are given for pain control contain acetaminophen as well. Therefore, you should not exceed more than 4000 mg of tylenol in a day if you do not have liver disease.  Also note that there are may OTC medicines, such as cold medicines and allergy medicines that my contain tylenol as well.  If you have any questions about medications and/or interactions please ask your doctor/PA or your pharmacist.      ICE AND ELEVATE INJURED/OPERATIVE EXTREMITY  Using ice and elevating the injured extremity above your heart can help with swelling and pain control.  Icing in a pulsatile fashion, such as 20 minutes on and 20 minutes off, can be followed.    Do not place ice directly on skin. Make sure there is a barrier between to skin and the ice pack.    Using frozen items such as frozen peas works well as the conform nicely to the are that needs to be iced.  USE AN ACE WRAP OR TED HOSE FOR SWELLING CONTROL  In addition to icing and elevation, Ace wraps or TED hose are used to help limit and resolve swelling.  It is recommended to use Ace wraps or  TED hose until you are informed to stop.    When using Ace Wraps start the wrapping distally (farthest away from the body) and wrap proximally (closer to the body)   Example: If you had surgery on your leg or thing and you do not have a splint on, start the ace wrap at the toes and work your way up to the thigh        If you had surgery on your upper extremity and do not have a splint on, start the ace wrap at your fingers and work your way up to the upper arm  IF YOU ARE  IN A SPLINT OR CAST DO NOT REMOVE IT FOR ANY REASON   If your splint gets wet for any reason please contact the office immediately. You may shower in your splint or cast as long as you keep it dry.  This can be done by wrapping in a cast cover or garbage back (or similar)  Do Not stick any thing down your splint or cast such as pencils, money, or hangers to try and scratch yourself with.  If you feel itchy take benadryl as prescribed on the bottle for itching  IF YOU ARE IN A CAM BOOT (BLACK BOOT)  You may remove boot periodically. Perform daily dressing changes as noted below.  Wash the liner of the boot regularly and wear a sock when wearing the boot. It is recommended that you sleep in the boot until told otherwise  CALL THE OFFICE WITH ANY QUESTIONS OR CONCERNS: [phone]   Driving restrictions    Complete by:  As directed    No driving   Increase activity slowly as tolerated    Complete by:  As directed    Non weight bearing    Complete by:  As directed    Laterality:  left   Extremity:  Lower     Allergies as of 03/06/2017      Reactions   Methotrexate Derivatives    UNSPECIFIED REACTION    Chantix [varenicline Tartrate] Rash      Medication List    TAKE these medications   ARIPiprazole 10 MG tablet Commonly known as:  ABILIFY Take 1 tablet (10 mg total) by mouth daily.   Cholecalciferol 5000 units capsule Take 1 capsule (5,000 Units total) by mouth daily.   Dexmethylphenidate HCl 30 MG Cp24 Commonly known as:  FOCALIN XR Take 1 capsule (30 mg total) by mouth daily.   docusate sodium 100 MG capsule Commonly known as:  COLACE Take 1 capsule (100 mg total) by mouth 2 (two) times daily.   DULoxetine 20 MG capsule Commonly known as:  CYMBALTA Take 20 mg by mouth daily.   enoxaparin 40 MG/0.4ML injection Commonly known as:  LOVENOX Inject 0.4 mLs (40 mg total) into the skin daily.   lamoTRIgine 200 MG tablet Commonly known as:  LAMICTAL Take 1 tablet (200 mg  total) by mouth daily.   methocarbamol 500 MG tablet Commonly known as:  ROBAXIN Take 1-2 tablets (500-1,000 mg total) by mouth every 6 (six) hours as needed for muscle spasms.   modafinil 200 MG tablet Commonly known as:  PROVIGIL Take 1 tablet (200 mg total) by mouth daily.   oxyCODONE 5 MG immediate release tablet Commonly known as:  Oxy IR/ROXICODONE Take 1-2 tablets (5-10 mg total) by mouth every 6 (six) hours as needed for breakthrough pain (take between percocet for breakthrough pain only).   oxyCODONE-acetaminophen 5-325 MG tablet Commonly known as:  PERCOCET/ROXICET Take 1-2 tablets by mouth every 6 (six) hours as needed for moderate pain or severe pain.   polyethylene glycol packet Commonly known as:  MIRALAX / GLYCOLAX Take 17 g by mouth daily.            Durable Medical Equipment        Start     Ordered   03/05/17 1531  For home use only DME Walker rolling  Once    Question:  Patient needs a walker to treat with the following condition  Answer:  Tibial plateau fracture, left   03/05/17 1531   03/05/17 1531  For home use only DME 3 n 1  Once     03/05/17 1531       Discharge Care Instructions        Start     Ordered   03/06/17 0000  docusate sodium (COLACE) 100 MG capsule  2 times daily    Question:  Supervising Provider  Answer:  HANDY, MICHAEL   03/06/17 0923   03/06/17 0000  enoxaparin (LOVENOX) 40 MG/0.4ML injection  Every 24 hours    Question:  Supervising Provider  Answer:  HANDY, MICHAEL   03/06/17 0923   03/06/17 0000  methocarbamol (ROBAXIN) 500 MG tablet  Every 6 hours PRN    Question:  Supervising Provider  Answer:  Myrene Galas   03/06/17 0923   03/06/17 0000  oxyCODONE (OXY IR/ROXICODONE) 5 MG immediate release tablet  Every 6 hours PRN    Question:  Supervising Provider  Answer:  Myrene Galas   03/06/17 0923   03/06/17 0000  oxyCODONE-acetaminophen (PERCOCET/ROXICET) 5-325 MG tablet  Every 6 hours PRN    Question:  Supervising  Provider  Answer:  HANDY, MICHAEL   03/06/17 0923   03/06/17 0000  polyethylene glycol (MIRALAX / Ethelene Hal) packet  Daily    Question:  Supervising Provider  Answer:  HANDY, MICHAEL   03/06/17 0923   03/06/17 0000  Cholecalciferol 5000 units capsule  Daily    Question:  Supervising Provider  Answer:  Myrene Galas   03/06/17 0923   03/06/17 0000  Call MD / Call 911    Comments:  If you experience chest pain or shortness of breath, CALL 911 and be transported to the hospital emergency room.  If you develope a fever above 101 F, pus (white drainage) or increased drainage or redness at the wound, or calf pain, call your surgeon's office.   03/06/17 1353   03/06/17 0000  Constipation Prevention    Comments:  Drink plenty of fluids.  Prune juice may be helpful.  You may use a stool softener, such as Colace (over the counter) 100 mg twice a day.  Use MiraLax (over the counter) for constipation as needed.   03/06/17 1353   03/06/17 0000  Increase activity slowly as tolerated     03/06/17 1353   03/06/17 0000  Discharge instructions    Comments:  Orthopaedic Trauma Service Discharge Instructions   General Discharge Instructions  WEIGHT BEARING STATUS: nonweightbearing Left leg   RANGE OF MOTION/ACTIVITY: unrestricted Range of motion Left knee. Ok to be out of brace for range of motion exercises. Wear brace when up moving around.   Home exercise program (HEP) as reviewed with you by therapy. Do 3-4 x/day   HEP for L knee ROM- AROM, PROM. Prone exercises as well. No ROM restrictions. Quad sets, SLR, LAQ, SAQ, heel slides, stretching, prone flexion and extension Heel cord stretching and ankle  theraband program   No pillows under bend of knee when at rest, ok to place under ankle to help work on knee extension, can use bone foam as well   Wound Care: daily dressing changes starting day after you get home. See below  Discharge Wound Care Instructions  Do NOT apply any ointments, solutions  or lotions to pin sites or surgical wounds.  These prevent needed drainage and even though solutions like hydrogen peroxide kill bacteria, they also damage cells lining the pin sites that help fight infection.  Applying lotions or ointments can keep the wounds moist and can cause them to breakdown and open up as well. This can increase the risk for infection. When in doubt call the office.  Surgical incisions should be dressed daily.  If any drainage is noted, use one layer of adaptic, then gauze, Kerlix, and an ace wrap.  Once the incision is completely dry and without drainage, it may be left open to air out.  Showering may begin 36-48 hours later.  Cleaning gently with soap and water.  Traumatic wounds should be dressed daily as well.    One layer of adaptic, gauze, Kerlix, then ace wrap.  The adaptic can be discontinued once the draining has ceased    If you have a wet to dry dressing: wet the gauze with saline the squeeze as much saline out so the gauze is moist (not soaking wet), place moistened gauze over wound, then place a dry gauze over the moist one, followed by Kerlix wrap, then ace wrap.  DVT/PE prophylaxis: Lovenox x 21 days   Diet: as you were eating previously.  Can use over the counter stool softeners and bowel preparations, such as Miralax, to help with bowel movements.  Narcotics can be constipating.  Be sure to drink plenty of fluids  PAIN MEDICATION USE AND EXPECTATIONS  You have likely been given narcotic medications to help control your pain.  After a traumatic event that results in an fracture (broken bone) with or without surgery, it is ok to use narcotic pain medications to help control one's pain.  We understand that everyone responds to pain differently and each individual patient will be evaluated on a regular basis for the continued need for narcotic medications. Ideally, narcotic medication use should last no more than 6-8 weeks (coinciding with fracture healing).    As a patient it is your responsibility as well to monitor narcotic medication use and report the amount and frequency you use these medications when you come to your office visit.   We would also advise that if you are using narcotic medications, you should take a dose prior to therapy to maximize you participation.  IF YOU ARE ON NARCOTIC MEDICATIONS IT IS NOT PERMISSIBLE TO OPERATE A MOTOR VEHICLE (MOTORCYCLE/CAR/TRUCK/MOPED) OR HEAVY MACHINERY DO NOT MIX NARCOTICS WITH OTHER CNS (CENTRAL NERVOUS SYSTEM) DEPRESSANTS SUCH AS ALCOHOL   STOP SMOKING OR USING NICOTINE PRODUCTS!!!!  As discussed nicotine severely impairs your body's ability to heal surgical and traumatic wounds but also impairs bone healing.  Wounds and bone heal by forming microscopic blood vessels (angiogenesis) and nicotine is a vasoconstrictor (essentially, shrinks blood vessels).  Therefore, if vasoconstriction occurs to these microscopic blood vessels they essentially disappear and are unable to deliver necessary nutrients to the healing tissue.  This is one modifiable factor that you can do to dramatically increase your chances of healing your injury.    (This means no smoking, no nicotine gum, patches, etc)  DO NOT USE NONSTEROIDAL ANTI-INFLAMMATORY DRUGS (NSAID'S)  Using products such as Advil (ibuprofen), Aleve (naproxen), Motrin (ibuprofen) for additional pain control during fracture healing can delay and/or prevent the healing response.  If you would like to take over the counter (OTC) medication, Tylenol (acetaminophen) is ok.  However, some narcotic medications that are given for pain control contain acetaminophen as well. Therefore, you should not exceed more than 4000 mg of tylenol in a day if you do not have liver disease.  Also note that there are may OTC medicines, such as cold medicines and allergy medicines that my contain tylenol as well.  If you have any questions about medications and/or interactions please ask  your doctor/PA or your pharmacist.      ICE AND ELEVATE INJURED/OPERATIVE EXTREMITY  Using ice and elevating the injured extremity above your heart can help with swelling and pain control.  Icing in a pulsatile fashion, such as 20 minutes on and 20 minutes off, can be followed.    Do not place ice directly on skin. Make sure there is a barrier between to skin and the ice pack.    Using frozen items such as frozen peas works well as the conform nicely to the are that needs to be iced.  USE AN ACE WRAP OR TED HOSE FOR SWELLING CONTROL  In addition to icing and elevation, Ace wraps or TED hose are used to help limit and resolve swelling.  It is recommended to use Ace wraps or TED hose until you are informed to stop.    When using Ace Wraps start the wrapping distally (farthest away from the body) and wrap proximally (closer to the body)   Example: If you had surgery on your leg or thing and you do not have a splint on, start the ace wrap at the toes and work your way up to the thigh        If you had surgery on your upper extremity and do not have a splint on, start the ace wrap at your fingers and work your way up to the upper arm  IF YOU ARE IN A SPLINT OR CAST DO NOT REMOVE IT FOR ANY REASON   If your splint gets wet for any reason please contact the office immediately. You may shower in your splint or cast as long as you keep it dry.  This can be done by wrapping in a cast cover or garbage back (or similar)  Do Not stick any thing down your splint or cast such as pencils, money, or hangers to try and scratch yourself with.  If you feel itchy take benadryl as prescribed on the bottle for itching  IF YOU ARE IN A CAM BOOT (BLACK BOOT)  You may remove boot periodically. Perform daily dressing changes as noted below.  Wash the liner of the boot regularly and wear a sock when wearing the boot. It is recommended that you sleep in the boot until told otherwise  CALL THE OFFICE WITH ANY QUESTIONS OR  CONCERNS: [phone]   03/06/17 1353   03/06/17 0000  Diet general     03/06/17 1353   03/06/17 0000  Non weight bearing    Question Answer Comment  Laterality left   Extremity Lower      03/06/17 1353   03/06/17 0000  Driving restrictions    Comments:  No driving   47/82/95 6213     Follow-up Information    [facility] Follow up.  Why:  A representative from [facility] will contact you to arrange start date and time for your therapy. Contact information: [address] [place] [address] [phone]        Myrene Galas, MD. Schedule an appointment as soon as possible for a visit in 10 day(s).   Specialty:  Orthopedic Surgery Why:  call for appointment time and date, follow up can be anywhere between 10-14 days from discharge  Contact information: [address] [place] [address] [phone]           Discharge Instructions and Plan:  41 year old female with left bicondylar tibial plateau fracture  -closed Left bicondylar tibial plateau fracture s/p ORIF             NWB L leg x 8 weeks             Unrestricted ROM L knee             Do not let knee rest in flexion, use pillows under ankle or bone foam to help maintain extension              Ice and elevate              TED hose              Dressing changes as needed             Ok to shower and clean wounds with soap and water                           PT/OT                          HEP for L knee ROM- AROM, PROM. Prone exercises as well. No ROM restrictions.  Quad sets, SLR, LAQ, SAQ, heel slides, stretching, prone flexion and extension Heel cord stretching and ankle theraband program    - Pain management:             Percocet and oxy IR   - ABL anemia/Hemodynamics             Robaxin    - Medical issues              Home meds   - DVT/PE prophylaxis:             lovenox x 21 days    - ID:              periop abx completed   -  Metabolic Bone Disease:             Labs look ok             Recommend vitamin d3 5000 IUs daily at dc    - Activity:             NWB L leg             Unrestricted ROM L knee   - FEN/GI prophylaxis/Foley/Lines:    Regular diet     - Impediments to fracture healing:             Nicotine use              - Dispo:             Dc home              Follow up  with ortho in 10-14 days       Signed:  Mearl Latin, PA-C [facility] [phone] (P) 03/06/2017, 1:53 PM

## 2017-03-07 LAB — NICOTINE/COTININE METABOLITES
Cotinine: 176.9 ng/mL
Nicotine: 2.4 ng/mL

## 2017-03-16 ENCOUNTER — Ambulatory Visit: Payer: 59 | Admitting: Licensed Clinical Social Worker

## 2017-03-22 ENCOUNTER — Ambulatory Visit: Payer: 59 | Attending: Orthopedic Surgery | Admitting: Physical Therapy

## 2017-03-22 DIAGNOSIS — R262 Difficulty in walking, not elsewhere classified: Secondary | ICD-10-CM | POA: Diagnosis present

## 2017-03-22 DIAGNOSIS — R2689 Other abnormalities of gait and mobility: Secondary | ICD-10-CM | POA: Insufficient documentation

## 2017-03-22 DIAGNOSIS — M25662 Stiffness of left knee, not elsewhere classified: Secondary | ICD-10-CM | POA: Diagnosis not present

## 2017-03-22 DIAGNOSIS — M25562 Pain in left knee: Secondary | ICD-10-CM | POA: Insufficient documentation

## 2017-03-22 NOTE — Therapy (Signed)
[facility] [address]  [address] [place], [place], [address] Phone: [phone]   Fax:  [phone]  Physical Therapy Evaluation  Patient Details  Name: Alice [name] MRN: [id number] Date of Birth: December 26, 1975 Referring Provider: Myrene Galas, MD  Encounter Date: 03/22/2017      PT End of Session - 03/22/17 0931    Visit Number 1   Number of Visits 26   Date for PT Re-Evaluation 06/14/17   Authorization Type [employer]   Authorization - Number of Visits 30   PT Start Time 0931   PT Stop Time 1021   PT Time Calculation (min) 50 min   Equipment Utilized During Treatment Left knee immobilizer  unlocked   Activity Tolerance Patient tolerated treatment well   Behavior During Therapy [facility] for tasks assessed/performed      Past Medical History:  Diagnosis Date  . ADHD (attention deficit hyperactivity disorder)   . Avascular necrosis of bone of hip (HCC)    RIGHT GRAFT  . Bipolar II disorder (HCC)   . Depression   . Nicotine dependence 03/06/2017  . PONV (postoperative nausea and vomiting)   . Wears glasses     Past Surgical History:  Procedure Laterality Date  . APPENDECTOMY  2000  . BREAST SURGERY  06/2009   RIGHT BREAST BIOPSY-NEGATIVE  . DILATION AND CURETTAGE OF UTERUS    . FASCIOTOMY Left 03/04/2017   Procedure: ANTERIOR COMPARTMENT FASCIOTOMY;  Surgeon: Myrene Galas, MD;  Location: [facility] OR;  Service: Orthopedics;  Laterality: Left;  . HIP ARTHROSCOPY  2008, 2010   AVASCULAR NECROSIS--RIGHT HIP 2010  . KNEE SURGERY  1995   ACL  . ORIF TIBIA PLATEAU Left 03/04/2017   Procedure: OPEN REDUCTION INTERNAL FIXATION (ORIF) TIBIAL PLATEAU;  Surgeon: Myrene Galas, MD;  Location: [facility] OR;  Service: Orthopedics;  Laterality: Left;  . SHOULDER SURGERY  [phone]  . TONSILLECTOMY    . WISDOM TOOTH EXTRACTION      There were no vitals filed for this visit.       Subjective Assessment - 03/22/17 0935    Subjective Misstepped coming down back stairs on 02/22/17 landing hard on foot resulting in med/lat tibial plateau fracture + mensical tear. Surgery on 03/04/17 for ORIF and mensical repair. NWB on L for 6 more weeks.   Pertinent History 03/04/17 - L bicondylar tibial plateau fracture s/p ORIF, L lateral meniscus tear s/p repair & L anterior compartment fasciotomy; 1995 - L ACL tear & tibial plateau fracture s/p surgical repair & MUA; R tibial plateau fracture; 2010 - R THA d/t avascular necrosis   Patient Stated Goals "get it to bend"   Currently in Pain? No/denies   Pain Score 0-No pain  up to 6/10   Pain Location Tibia   Pain Orientation Left;Upper   Pain Descriptors / Indicators Throbbing   Pain Type Acute pain;Surgical pain   Pain Onset 1 to 4 weeks ago   Pain Frequency Intermittent   Aggravating Factors  exercises   Pain Relieving Factors pain meds, ice   Effect of Pain on Daily Activities not at this time            [facility] PT Assessment - 03/22/17 0931      Assessment   Medical Diagnosis L bicondylar tibial plateau fracture s/p ORIF, L lateral meniscus tear s/p repair & L anterior compartment fasciotomy   Referring Provider Myrene Galas, MD   Onset Date/Surgical Date 03/04/17  date of injury -  02/22/17, surgery - 03/04/17   Next MD Visit 04/09/17   Prior Therapy HH PT until 03/15/17     Precautions   Required Braces or Orthoses Knee Immobilizer - Left  unlocked (-10 to full flexion)   Knee Immobilizer - Left On when out of bed or walking  Pt reporting MD ok with weaning at home      Restrictions   Weight Bearing Restrictions Yes   LLE Weight Bearing Non weight bearing  x6 wks     Balance Screen   Has the patient fallen in the past 6 months Yes   How many times? 1   Has the patient had a decrease in activity level because of a fear of falling?  No   Is the patient reluctant to leave their home because of a fear of falling?  No     Home Environment   Living Environment  Private residence   Living Arrangements Alone   Type of Home Apartment  2nd floor   Home Access Stairs to enter   Entrance Stairs-Number of Steps 24   Entrance Stairs-Rails Right;Left;Cannot reach both   Home Layout One level   Home Equipment Crutches;Walker - standard     Prior Function   Level of Independence Independent   Vocation Full time employment   Biochemist, clinical - most deskwork   Leisure mostly sedentary     Observation/Other Assessments   Focus on Therapeutic Outcomes (FOTO)  knee 41% (59% limitation); predicted 62% (38% limitation)     ROM / Strength   AROM / PROM / Strength AROM;PROM;Strength     AROM   AROM Assessment Site Knee;Ankle   Right/Left Knee Right;Left   Right Knee Extension -3   Right Knee Flexion 137   Left Knee Extension 0   Left Knee Flexion 41  supine, 46 dg seated   Right/Left Ankle Left   Left Ankle Dorsiflexion -5   Left Ankle Plantar Flexion 58   Left Ankle Inversion 48   Left Ankle Eversion 15     PROM   PROM Assessment Site Knee   Right/Left Knee Left   Left Knee Extension 0   Left Knee Flexion 47     Flexibility   Soft Tissue Assessment /Muscle Length yes   Hamstrings WFL            Objective measurements completed on examination: See above findings.          [facility] Adult PT Treatment/Exercise - 03/22/17 0931      Exercises   Exercises Knee/Hip     Knee/Hip Exercises: Stretches   Gastroc Stretch Left;30 seconds;2 reps   Gastroc Stretch Limitations seated with strap   Soleus Stretch Left;30 seconds;2 reps   Soleus Stretch Limitations seated with strap     Knee/Hip Exercises: Seated   Heel Slides Left;10 reps   Heel Slides Limitations with towel to reduce friction on tile     Knee/Hip Exercises: Supine   Short Arc Quad Sets Left;10 reps   Short Arc Quad Sets Limitations 5" hold + 5" hold flexion stretch over 8" bolster   Heel Slides Left;AAROM;10 reps   Heel Slides Limitations  longstitting with strap                PT Education - 03/22/17 1015    Education provided Yes   Education Details PT eval findings, anticipated POC & initial HEP   Person(s) Educated Patient   Methods Explanation;Demonstration;Handout   Comprehension Verbalized  understanding;Returned demonstration;Need further instruction          PT Short Term Goals - 03/22/17 1021      PT SHORT TERM GOAL #1   Title Independent with initial HEP   Status New   Target Date 04/19/17     PT SHORT TERM GOAL #2   Title L knee AROM 0-80 dg    Status New   Target Date 04/19/17           PT Long Term Goals - 03/22/17 1021      PT LONG TERM GOAL #1   Title Independent with ongoing HEP   Status New   Target Date 06/14/17     PT LONG TERM GOAL #2   Title L knee AROM 0-120    Status New   Target Date 06/14/17     PT LONG TERM GOAL #3   Title L hip and knee strength >/= 4/5   Status New   Target Date 06/14/17     PT LONG TERM GOAL #4   Title Pt will ambulate with normal gait pattern w/o AD on level surfaces   Status New   Target Date 06/14/17     PT LONG TERM GOAL #5   Title Pt will ascend/descend a flight of stairs with good reciprocal pattern to allow increased ease of access to 2nd floor apt   Status New   Target Date 06/14/17                Plan - 03/22/17 1021    Clinical Impression Statement Alice [name] is a 41 y/o female who presents to OP PT following L bicondylar tibial plateau fracture s/p ORIF, L lateral meniscus tear s/p repair & L anterior compartment fasciotomy on 03/04/17 with initial injury on 02/22/17. Pt has h/o prior L tibial plateau fracture with ACL tear for which she has surgery and manipulation in 1995, R tibial plateau fracture and R THA for avascular necrosis. Pt presents to PT on crutches in unlocked long leg knee brace, NWB on L for 6 more weeks. L knee AROM 0-41 and PROM 0-47 with L DF limited to -5 degrees. Pt reporting MD wants knee flexion >60  dg within 2 weeks. Pt will benefit from PT with initial frequency of 3x/wk focusing on A/AA/PROM, manual therapy and modalities PRN to restore ROM and reduce risk of contracture; with progression per MD as pt allowed to resume weight bearing.    History and Personal Factors relevant to plan of care: 03/04/17 - L bicondylar tibial plateau fracture s/p ORIF, L lateral meniscus tear s/p repair & L anterior compartment fasciotomy; 1995 - L ACL tear & tibial plateau fracture s/p surgical repair & MUA; R tibial plateau fracture; 2010 - R THA d/t avascular necrosis   Clinical Presentation Evolving   Clinical Presentation due to: limited L knee ROM progression to date since surgery with high risk for contracture & ineligible for manipulation    Clinical Decision Making Moderate   Rehab Potential Good   Clinical Impairments Affecting Rehab Potential h/o L ACL tear & tibial plateau fracture s/p surgical repair & MUA; R tibial plateau fracture; 2010 - R THA d/t avascular necrosis   PT Frequency 3x / week  tapering to 2x/wk after 3-4 wks   PT Duration 12 weeks   PT Treatment/Interventions Patient/family education;Manual techniques;Passive range of motion;Scar mobilization;Dry needling;Taping;Therapeutic exercise;Neuromuscular re-education;Therapeutic activities;Functional mobility training;Gait training;Stair training;Moist Heat;Electrical Stimulation;Cryotherapy;Vasopneumatic Device;Ultrasound;Iontophoresis /ml Dexamethasone;ADLs/Self Care Home Management   Consulted and Agree  with Plan of Care Patient      Patient will benefit from skilled therapeutic intervention in order to improve the following deficits and impairments:  Pain, Decreased range of motion, Impaired flexibility, Increased muscle spasms, Increased fascial restricitons, Decreased strength, Abnormal gait, Difficulty walking, Decreased activity tolerance, Decreased balance, Decreased scar mobility  Visit Diagnosis: Stiffness of left knee, not  elsewhere classified  Acute pain of left knee  Difficulty in walking, not elsewhere classified  Other abnormalities of gait and mobility     Problem List Patient Active Problem List   Diagnosis Date Noted  . Nicotine dependence 03/06/2017  . Bipolar II disorder (HCC)   . Closed bicondylar fracture of left tibial plateau 03/04/2017  . Avascular necrosis of bone of hip (HCC) 01/14/2017  . Chronic hip pain 01/14/2017  . ADD (attention deficit disorder) 11/10/2016  . 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 Personal history of hyperthyroidism 07/16/2011  . Knee pain 05/06/2011    Marry Guan, PT, MPT 03/22/2017, 6:32 PM  [facility] [address]  [address] [place], [place], [address] Phone: [phone]   Fax:  [phone]  Name: Alice [name] MRN: [id number] Date of Birth: Feb 21, 1976

## 2017-03-23 ENCOUNTER — Ambulatory Visit: Payer: 59 | Admitting: Physical Therapy

## 2017-03-23 ENCOUNTER — Encounter: Payer: Self-pay | Admitting: Physical Therapy

## 2017-03-23 DIAGNOSIS — M25562 Pain in left knee: Secondary | ICD-10-CM

## 2017-03-23 DIAGNOSIS — R262 Difficulty in walking, not elsewhere classified: Secondary | ICD-10-CM

## 2017-03-23 DIAGNOSIS — M25662 Stiffness of left knee, not elsewhere classified: Secondary | ICD-10-CM

## 2017-03-23 DIAGNOSIS — R2689 Other abnormalities of gait and mobility: Secondary | ICD-10-CM

## 2017-03-23 NOTE — Therapy (Signed)
[facility] [address]  [address] [place], [place], [address] Phone: [phone]   Fax:  [phone]  Physical Therapy Treatment  Patient Details  Name: Alice [name] MRN: [id number] Date of Birth: 04-30-1976 Referring Provider: Myrene Galas, MD  Encounter Date: 03/23/2017      PT End of Session - 03/23/17 0804    Visit Number 2   Number of Visits 26   Date for PT Re-Evaluation 06/14/17   Authorization Type UHC   Authorization - Number of Visits 30   PT Start Time 0804   PT Stop Time 0855   PT Time Calculation (min) 51 min   Equipment Utilized During Treatment Left knee immobilizer  unlocked   Activity Tolerance Patient tolerated treatment well   Behavior During Therapy [facility] for tasks assessed/performed      Past Medical History:  Diagnosis Date  . ADHD (attention deficit hyperactivity disorder)   . Avascular necrosis of bone of hip (HCC)    RIGHT GRAFT  . Bipolar II disorder (HCC)   . Depression   . Nicotine dependence 03/06/2017  . PONV (postoperative nausea and vomiting)   . Wears glasses     Past Surgical History:  Procedure Laterality Date  . APPENDECTOMY  2000  . BREAST SURGERY  06/2009   RIGHT BREAST BIOPSY-NEGATIVE  . DILATION AND CURETTAGE OF UTERUS    . FASCIOTOMY Left 03/04/2017   Procedure: ANTERIOR COMPARTMENT FASCIOTOMY;  Surgeon: Myrene Galas, MD;  Location: [facility] OR;  Service: Orthopedics;  Laterality: Left;  . HIP ARTHROSCOPY  2008, 2010   AVASCULAR NECROSIS--RIGHT HIP 2010  . KNEE SURGERY  1995   ACL  . ORIF TIBIA PLATEAU Left 03/04/2017   Procedure: OPEN REDUCTION INTERNAL FIXATION (ORIF) TIBIAL PLATEAU;  Surgeon: Myrene Galas, MD;  Location: [facility] OR;  Service: Orthopedics;  Laterality: Left;  . SHOULDER SURGERY  [phone]  . TONSILLECTOMY    . WISDOM TOOTH EXTRACTION      There were no vitals filed for this visit.      Subjective Assessment - 03/23/17 0816    Subjective  No new c/o.   Pertinent History 03/04/17 - L bicondylar tibial plateau fracture s/p ORIF, L lateral meniscus tear s/p repair & L anterior compartment fasciotomy; 1995 - L ACL tear & tibial plateau fracture s/p surgical repair & MUA; R tibial plateau fracture; 2010 - R THA d/t avascular necrosis   Patient Stated Goals "get it to bend"   Currently in Pain? Yes   Pain Score 2    Pain Location Tibia   Pain Orientation Left;Upper   Pain Descriptors / Indicators Throbbing   Pain Type Acute pain;Surgical pain            [facility] PT Assessment - 03/22/17 0931      Assessment   Medical Diagnosis L bicondylar tibial plateau fracture s/p ORIF, L lateral meniscus tear s/p repair & L anterior compartment fasciotomy   Referring Provider Myrene Galas, MD   Onset Date/Surgical Date 03/04/17  date of injury - 02/22/17, surgery - 03/04/17   Next MD Visit 04/09/17   Prior Therapy HH PT until 03/15/17     Precautions   Required Braces or Orthoses Knee Immobilizer - Left  unlocked (-10 to full flexion)   Knee Immobilizer - Left On when out of bed or walking  Pt reporting MD ok with weaning at home      Restrictions   Weight Bearing Restrictions Yes  LLE Weight Bearing Non weight bearing  x6 wks     Balance Screen   Has the patient fallen in the past 6 months Yes   How many times? 1   Has the patient had a decrease in activity level because of a fear of falling?  No   Is the patient reluctant to leave their home because of a fear of falling?  No     Home Environment   Living Environment Private residence   Living Arrangements Alone   Type of Home Apartment  2nd floor   Home Access Stairs to enter   Entrance Stairs-Number of Steps 24   Entrance Stairs-Rails Right;Left;Cannot reach both   Home Layout One level   Home Equipment Crutches;Walker - standard     Prior Function   Level of Independence Independent   Vocation Full time employment   Biochemist, clinical - most  deskwork   Leisure mostly sedentary     Observation/Other Assessments   Focus on Therapeutic Outcomes (FOTO)  knee 41% (59% limitation); predicted 62% (38% limitation)     ROM / Strength   AROM / PROM / Strength AROM;PROM;Strength     AROM   AROM Assessment Site Knee;Ankle   Right/Left Knee Right;Left   Right Knee Extension -3   Right Knee Flexion 137   Left Knee Extension 0   Left Knee Flexion 41  supine, 46 dg seated   Right/Left Ankle Left   Left Ankle Dorsiflexion -5   Left Ankle Plantar Flexion 58   Left Ankle Inversion 48   Left Ankle Eversion 15     PROM   PROM Assessment Site Knee   Right/Left Knee Left   Left Knee Extension 0   Left Knee Flexion 47     Flexibility   Soft Tissue Assessment /Muscle Length yes   Hamstrings [facility]                     [facility] Adult PT Treatment/Exercise - 03/23/17 0804      Knee/Hip Exercises: Aerobic   Recumbent Bike rocking for ROM x 6'     Knee/Hip Exercises: Seated   Heel Slides Left;15 reps;2 sets;AROM;AAROM   Heel Slides Limitations 1st set with foot on small ball; 2nd set with AAROM - R foot overpressure     Knee/Hip Exercises: Supine   Heel Slides Left;AAROM;20 reps   Heel Slides Limitations longstitting with strap & towel under foot   Patellar Mobs all directions   Knee Flexion Left;AAROM;20 reps   Knee Flexion Limitations heels on peanut ball; PT assist for slight overpressure     Modalities   Modalities Vasopneumatic     Vasopneumatic   Number Minutes Vasopneumatic  10 minutes   Vasopnuematic Location  Knee   Vasopneumatic Pressure High   Vasopneumatic Temperature  lowest     Manual Therapy   Manual Therapy Soft tissue mobilization;Joint mobilization;Passive ROM   Joint Mobilization L knee - patellar mobs all directions; grade II-II A/P mobs for flexion ROM   Soft tissue mobilization L distal quads   Passive ROM L knee flexion & extension to tolerance                PT Education -  03/22/17 1015    Education provided Yes   Education Details PT eval findings, anticipated POC & initial HEP   Person(s) Educated Patient   Methods Explanation;Demonstration;Handout   Comprehension Verbalized understanding;Returned demonstration;Need further instruction  PT Short Term Goals - 03/23/17 0804      PT SHORT TERM GOAL #1   Title Independent with initial HEP   Status On-going     PT SHORT TERM GOAL #2   Title L knee AROM 0-80 dg    Status On-going           PT Long Term Goals - 03/23/17 0818      PT LONG TERM GOAL #1   Title Independent with ongoing HEP   Status On-going     PT LONG TERM GOAL #2   Title L knee AROM 0-120    Status On-going     PT LONG TERM GOAL #3   Title L hip and knee strength >/= 4/5   Status On-going     PT LONG TERM GOAL #4   Title Pt will ambulate with normal gait pattern w/o AD on level surfaces   Status On-going     PT LONG TERM GOAL #5   Title Pt will ascend/descend a flight of stairs with good reciprocal pattern to allow increased ease of access to 2nd floor apt   Status On-going               Plan - 03/23/17 0818    Clinical Impression Statement Pt with good tolerance for ROM activities but limited change in ROM noted. Some increased quad muscle tension noted and addressed with manual therapy/STM. If this persists, may consider DN. Gentle joint and patellar mobs incorporated to promote increased ROM.   Rehab Potential Good   Clinical Impairments Affecting Rehab Potential h/o L ACL tear & tibial plateau fracture s/p surgical repair & MUA; R tibial plateau fracture; 2010 - R THA d/t avascular necrosis   PT Treatment/Interventions Patient/family education;Manual techniques;Passive range of motion;Scar mobilization;Dry needling;Taping;Therapeutic exercise;Neuromuscular re-education;Therapeutic activities;Functional mobility training;Gait training;Stair training;Moist Heat;Electrical  Stimulation;Cryotherapy;Vasopneumatic Device;Ultrasound;Iontophoresis /ml Dexamethasone;ADLs/Self Care Home Management   Consulted and Agree with Plan of Care Patient      Patient will benefit from skilled therapeutic intervention in order to improve the following deficits and impairments:  Pain, Decreased range of motion, Impaired flexibility, Increased muscle spasms, Increased fascial restricitons, Decreased strength, Abnormal gait, Difficulty walking, Decreased activity tolerance, Decreased balance, Decreased scar mobility  Visit Diagnosis: Stiffness of left knee, not elsewhere classified  Acute pain of left knee  Difficulty in walking, not elsewhere classified  Other abnormalities of gait and mobility     Problem List Patient Active Problem List   Diagnosis Date Noted  . Nicotine dependence 03/06/2017  . Bipolar II disorder (HCC)   . Closed bicondylar fracture of left tibial plateau 03/04/2017  . Avascular necrosis of bone of hip (HCC) 01/14/2017  . Chronic hip pain 01/14/2017  . ADD (attention deficit disorder) 11/10/2016  . 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 Personal history of hyperthyroidism 07/16/2011  . Knee pain 05/06/2011    Marry Guan, PT, MPT 03/23/2017, 1:11 PM  [facility] [address]  [address] [place], [place], [address] Phone: [phone]   Fax:  [phone]  Name: Alice [name] MRN: [id number] Date of Birth: 12/25/1975

## 2017-03-26 ENCOUNTER — Ambulatory Visit: Payer: 59 | Admitting: Physical Therapy

## 2017-03-26 DIAGNOSIS — M25662 Stiffness of left knee, not elsewhere classified: Secondary | ICD-10-CM | POA: Diagnosis not present

## 2017-03-26 DIAGNOSIS — R262 Difficulty in walking, not elsewhere classified: Secondary | ICD-10-CM

## 2017-03-26 DIAGNOSIS — R2689 Other abnormalities of gait and mobility: Secondary | ICD-10-CM

## 2017-03-26 DIAGNOSIS — M25562 Pain in left knee: Secondary | ICD-10-CM

## 2017-03-26 NOTE — Patient Instructions (Addendum)

## 2017-03-26 NOTE — Therapy (Signed)
[facility] [address]  [address] [place], [place], [address] Phone: [phone]   Fax:  [phone]  Physical Therapy Treatment  Patient Details  Name: Alice [name] MRN: [id number] Date of Birth: 1976/05/13 Referring Provider: Myrene Galas, MD  Encounter Date: 03/26/2017      PT End of Session - 03/26/17 0808    Visit Number 3   Number of Visits 26   Date for PT Re-Evaluation 06/14/17   Authorization Type UHC (benefit period July - June)   Authorization - Number of Visits 27  3 visits used with [facility] PT   PT Start Time 0800   PT Stop Time 0907   PT Time Calculation (min) 67 min   Equipment Utilized During Treatment Left knee immobilizer  unlocked   Activity Tolerance Patient tolerated treatment well   Behavior During Therapy [facility] for tasks assessed/performed      Past Medical History:  Diagnosis Date  . ADHD (attention deficit hyperactivity disorder)   . Avascular necrosis of bone of hip (HCC)    RIGHT GRAFT  . Bipolar II disorder (HCC)   . Depression   . Nicotine dependence 03/06/2017  . PONV (postoperative nausea and vomiting)   . Wears glasses     Past Surgical History:  Procedure Laterality Date  . APPENDECTOMY  2000  . BREAST SURGERY  06/2009   RIGHT BREAST BIOPSY-NEGATIVE  . DILATION AND CURETTAGE OF UTERUS    . FASCIOTOMY Left 03/04/2017   Procedure: ANTERIOR COMPARTMENT FASCIOTOMY;  Surgeon: Myrene Galas, MD;  Location: [facility] OR;  Service: Orthopedics;  Laterality: Left;  . HIP ARTHROSCOPY  2008, 2010   AVASCULAR NECROSIS--RIGHT HIP 2010  . KNEE SURGERY  1995   ACL  . ORIF TIBIA PLATEAU Left 03/04/2017   Procedure: OPEN REDUCTION INTERNAL FIXATION (ORIF) TIBIAL PLATEAU;  Surgeon: Myrene Galas, MD;  Location: [facility] OR;  Service: Orthopedics;  Laterality: Left;  . SHOULDER SURGERY  [phone]  . TONSILLECTOMY    . WISDOM TOOTH EXTRACTION      There were no vitals filed for this visit.       Subjective Assessment - 03/26/17 0807    Subjective Pt reporting no pain today but states it flared up Wed night - thinks she turned wrong in her sleep.   Pertinent History 03/04/17 - L bicondylar tibial plateau fracture s/p ORIF, L lateral meniscus tear s/p repair & L anterior compartment fasciotomy; 1995 - L ACL tear & tibial plateau fracture s/p surgical repair & MUA; R tibial plateau fracture; 2010 - R THA d/t avascular necrosis   Patient Stated Goals "get it to bend"   Currently in Pain? No/denies   Pain Score 0-No pain                         [facility] Adult PT Treatment/Exercise - 03/26/17 0800      Knee/Hip Exercises: Aerobic   Nustep lvl 3 x 6'     Knee/Hip Exercises: Supine   Heel Slides Left;AAROM;20 reps   Heel Slides Limitations longstitting with strap & towel under foot + manual inf pattellar mobs by PT   Patellar Mobs all directions   Knee Flexion Left;AAROM;20 reps   Knee Flexion Limitations heels on peanut ball; PT assist for slight overpressure     Modalities   Modalities Vasopneumatic     Vasopneumatic   Number Minutes Vasopneumatic  10 minutes   Vasopnuematic Location  Knee  Vasopneumatic Pressure High   Vasopneumatic Temperature  lowest     Manual Therapy   Manual Therapy Soft tissue mobilization;Joint mobilization;Myofascial release;Passive ROM   Joint Mobilization L knee - patellar mobs all directions; grade II-III A/P mobs in hooklying and sitting for flexion ROM   Soft tissue mobilization STM & IASTM with roller stick to L distal quads, stumming to L ITB   Myofascial Release TPR to L lateral quads   Passive ROM L knee flexion & extension to tolerance                  PT Short Term Goals - 03/23/17 0804      PT SHORT TERM GOAL #1   Title Independent with initial HEP   Status On-going     PT SHORT TERM GOAL #2   Title L knee AROM 0-80 dg    Status On-going           PT Long Term Goals - 03/23/17 0818      PT  LONG TERM GOAL #1   Title Independent with ongoing HEP   Status On-going     PT LONG TERM GOAL #2   Title L knee AROM 0-120    Status On-going     PT LONG TERM GOAL #3   Title L hip and knee strength >/= 4/5   Status On-going     PT LONG TERM GOAL #4   Title Pt will ambulate with normal gait pattern w/o AD on level surfaces   Status On-going     PT LONG TERM GOAL #5   Title Pt will ascend/descend a flight of stairs with good reciprocal pattern to allow increased ease of access to 2nd floor apt   Status On-going               Plan - 03/26/17 0808    Clinical Impression Statement Pt demonstrating tendency to maintain L ankle in inverted/supinated position while walking and is noting increased lateral edema in ankle. Pt already performing gastroc/soleus stretches, but added ankle circles to promote improved ROM and circulation. L knee ROM slowly progressing, with pt able to achieve 51 dg of flexion by end of session. Increased muscle tension with several tender points/TPs noted in lateral quads and ITB. Discussed possibility of DN on next visit to address these TPs/increased muscle tension in effort to promote increased muscle flexibility/decreased muscle tension for improved ROM. In the meantime pt instructed in ITB stretching as well as rolling for quads and ITB to reduce muscle tension for improved flexibility/ROM   Rehab Potential Good   Clinical Impairments Affecting Rehab Potential h/o L ACL tear & tibial plateau fracture s/p surgical repair & MUA; R tibial plateau fracture; 2010 - R THA d/t avascular necrosis   PT Treatment/Interventions Patient/family education;Manual techniques;Passive range of motion;Scar mobilization;Dry needling;Taping;Therapeutic exercise;Neuromuscular re-education;Therapeutic activities;Functional mobility training;Gait training;Stair training;Moist Heat;Electrical Stimulation;Cryotherapy;Vasopneumatic Device;Ultrasound;Iontophoresis /ml  Dexamethasone;ADLs/Self Care Home Management   Consulted and Agree with Plan of Care Patient      Patient will benefit from skilled therapeutic intervention in order to improve the following deficits and impairments:  Pain, Decreased range of motion, Impaired flexibility, Increased muscle spasms, Increased fascial restricitons, Decreased strength, Abnormal gait, Difficulty walking, Decreased activity tolerance, Decreased balance, Decreased scar mobility  Visit Diagnosis: Stiffness of left knee, not elsewhere classified  Acute pain of left knee  Difficulty in walking, not elsewhere classified  Other abnormalities of gait and mobility     Problem List Patient Active  Problem List   Diagnosis Date Noted  . Nicotine dependence 03/06/2017  . Bipolar II disorder (HCC)   . Closed bicondylar fracture of left tibial plateau 03/04/2017  . Avascular necrosis of bone of hip (HCC) 01/14/2017  . Chronic hip pain 01/14/2017  . ADD (attention deficit disorder) 11/10/2016  . 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 Personal history of hyperthyroidism 07/16/2011  . Knee pain 05/06/2011    Marry Guan, PT, MPT 03/26/2017, 9:58 AM  [facility] [address]  [address] [place], [place], [address] Phone: [phone]   Fax:  [phone]  Name: Alice [name] MRN: [id number] Date of Birth: [birth year]

## 2017-03-29 ENCOUNTER — Encounter: Payer: Self-pay | Admitting: Physical Therapy

## 2017-03-29 ENCOUNTER — Ambulatory Visit: Payer: 59 | Admitting: Physical Therapy

## 2017-03-30 ENCOUNTER — Ambulatory Visit: Payer: 59

## 2017-03-30 DIAGNOSIS — R2689 Other abnormalities of gait and mobility: Secondary | ICD-10-CM

## 2017-03-30 DIAGNOSIS — M25662 Stiffness of left knee, not elsewhere classified: Secondary | ICD-10-CM | POA: Diagnosis not present

## 2017-03-30 DIAGNOSIS — R262 Difficulty in walking, not elsewhere classified: Secondary | ICD-10-CM

## 2017-03-30 DIAGNOSIS — M25562 Pain in left knee: Secondary | ICD-10-CM

## 2017-03-30 NOTE — Therapy (Signed)
[facility] [address]  [address] [place], [place], [address] Phone: [phone]   Fax:  [phone]  Physical Therapy Treatment  Patient Details  Name: Alice [name] MRN: [id number] Date of Birth: 1976/04/12 Referring Provider: Myrene Galas, MD  Encounter Date: 03/30/2017      PT End of Session - 03/30/17 1539    Visit Number 4   Number of Visits 26   Date for PT Re-Evaluation 06/14/17   Authorization Type UHC (benefit period July - June)   Authorization - Number of Visits 27  3 visits used with [facility] PT   PT Start Time 1532   PT Stop Time 1625   PT Time Calculation (min) 53 min   Equipment Utilized During Treatment Left knee immobilizer  unlocked   Activity Tolerance Patient tolerated treatment well   Behavior During Therapy [facility] for tasks assessed/performed      Past Medical History:  Diagnosis Date  . ADHD (attention deficit hyperactivity disorder)   . Avascular necrosis of bone of hip (HCC)    RIGHT GRAFT  . Bipolar II disorder (HCC)   . Depression   . Nicotine dependence 03/06/2017  . PONV (postoperative nausea and vomiting)   . Wears glasses     Past Surgical History:  Procedure Laterality Date  . APPENDECTOMY  2000  . BREAST SURGERY  06/2009   RIGHT BREAST BIOPSY-NEGATIVE  . DILATION AND CURETTAGE OF UTERUS    . FASCIOTOMY Left 03/04/2017   Procedure: ANTERIOR COMPARTMENT FASCIOTOMY;  Surgeon: Myrene Galas, MD;  Location: [facility] OR;  Service: Orthopedics;  Laterality: Left;  . HIP ARTHROSCOPY  2008, 2010   AVASCULAR NECROSIS--RIGHT HIP 2010  . KNEE SURGERY  1995   ACL  . ORIF TIBIA PLATEAU Left 03/04/2017   Procedure: OPEN REDUCTION INTERNAL FIXATION (ORIF) TIBIAL PLATEAU;  Surgeon: Myrene Galas, MD;  Location: [facility] OR;  Service: Orthopedics;  Laterality: Left;  . SHOULDER SURGERY  [phone]  . TONSILLECTOMY    . WISDOM TOOTH EXTRACTION      There were no vitals filed for this visit.       Subjective Assessment - 03/30/17 1605    Subjective Pt. reporting she has been performing HEP and purchased strap from local pet store to use for stretches.     Patient Stated Goals "get it to bend"   Currently in Pain? No/denies   Pain Score 0-No pain   Multiple Pain Sites No                         [facility] Adult PT Treatment/Exercise - 03/30/17 1537      Knee/Hip Exercises: Stretches   Hip Flexor Stretch Left;60 seconds   Hip Flexor Stretch Limitations mod thomas with LE on 6" step with strap + manual strumming to L quad      Knee/Hip Exercises: Aerobic   Nustep lvl 3 x 6'     Knee/Hip Exercises: Supine   Short Arc Quad Sets Left;10 reps   Short Arc Quad Sets Limitations 5" hold; 1#    Heel Slides Left;AAROM;20 reps   Heel Slides Limitations longstitting with strap & towel under foot    Patellar Mobs all directions   Knee Flexion Left;AAROM;20 reps   Knee Flexion Limitations heels on peanut ball; therapist assist for slight overpressure     Vasopneumatic   Number Minutes Vasopneumatic  10 minutes   Vasopnuematic Location  Knee   Vasopneumatic Pressure  High   Vasopneumatic Temperature  lowest     Manual Therapy   Manual Therapy Soft tissue mobilization;Joint mobilization;Myofascial release;Passive ROM   Joint Mobilization L knee - patellar mobs all directions; grade II-III A/P mobs in hooklying for flexion ROM    Soft tissue mobilization STM & IASTM with roller stick to L distal quads, stumming to L ITB   Myofascial Release TPR to L lateral quads; palpable TP's and tenderness    Passive ROM L knee flexion & extension to tolerance                  PT Short Term Goals - 03/23/17 0804      PT SHORT TERM GOAL #1   Title Independent with initial HEP   Status On-going     PT SHORT TERM GOAL #2   Title L knee AROM 0-80 dg    Status On-going           PT Long Term Goals - 03/23/17 0818      PT LONG TERM GOAL #1   Title Independent with  ongoing HEP   Status On-going     PT LONG TERM GOAL #2   Title L knee AROM 0-120    Status On-going     PT LONG TERM GOAL #3   Title L hip and knee strength >/= 4/5   Status On-going     PT LONG TERM GOAL #4   Title Pt will ambulate with normal gait pattern w/o AD on level surfaces   Status On-going     PT LONG TERM GOAL #5   Title Pt will ascend/descend a flight of stairs with good reciprocal pattern to allow increased ease of access to 2nd floor apt   Status On-going               Plan - 03/30/17 1604    Clinical Impression Statement Pt. doing well reporting she purchased strap from local pet store to perform stretches with HEP.  Tolerated all activities in treatment well.  STM, patellar mobs, and gentle stretching performed for improved flexion ROM today.  Pt. remains very guarded with L LE movement.  Some pain to end treatment thus ice/compression applied to L knee to decrease swelling and pain.     Clinical Impairments Affecting Rehab Potential h/o L ACL tear & tibial plateau fracture s/p surgical repair & MUA; R tibial plateau fracture; 2010 - R THA d/t avascular necrosis   PT Treatment/Interventions Patient/family education;Manual techniques;Passive range of motion;Scar mobilization;Dry needling;Taping;Therapeutic exercise;Neuromuscular re-education;Therapeutic activities;Functional mobility training;Gait training;Stair training;Moist Heat;Electrical Stimulation;Cryotherapy;Vasopneumatic Device;Ultrasound;Iontophoresis /ml Dexamethasone;ADLs/Self Care Home Management      Patient will benefit from skilled therapeutic intervention in order to improve the following deficits and impairments:  Pain, Decreased range of motion, Impaired flexibility, Increased muscle spasms, Increased fascial restricitons, Decreased strength, Abnormal gait, Difficulty walking, Decreased activity tolerance, Decreased balance, Decreased scar mobility  Visit Diagnosis: Stiffness of left knee,  not elsewhere classified  Acute pain of left knee  Difficulty in walking, not elsewhere classified  Other abnormalities of gait and mobility     Problem List Patient Active Problem List   Diagnosis Date Noted  . Nicotine dependence 03/06/2017  . Bipolar II disorder (HCC)   . Closed bicondylar fracture of left tibial plateau 03/04/2017  . Avascular necrosis of bone of hip (HCC) 01/14/2017  . Chronic hip pain 01/14/2017  . ADD (attention deficit disorder) 11/10/2016  . 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 Personal history  of hyperthyroidism 07/16/2011  . Knee pain 05/06/2011    Kermit Balo, PTA 03/30/17 6:38 PM   [facility] [address]  [address] [place], [place], [address] Phone: [phone]   Fax:  [phone]  Name: Alice [name] MRN: [id number] Date of Birth: [birth year]

## 2017-03-31 ENCOUNTER — Ambulatory Visit: Payer: 59 | Admitting: Physical Therapy

## 2017-03-31 ENCOUNTER — Telehealth (HOSPITAL_COMMUNITY): Payer: Self-pay

## 2017-03-31 NOTE — Telephone Encounter (Signed)
Patient is calling to report that she is still having some fatigue and concentration issues in the afternoon, she was wanting to add a "low dose (15 mg) immediate release Adderall" in the afternoon to supplement the Modafinil. Please review and advise, thank you

## 2017-04-01 ENCOUNTER — Ambulatory Visit: Payer: 59 | Admitting: Physical Therapy

## 2017-04-01 DIAGNOSIS — M25662 Stiffness of left knee, not elsewhere classified: Secondary | ICD-10-CM

## 2017-04-01 DIAGNOSIS — R262 Difficulty in walking, not elsewhere classified: Secondary | ICD-10-CM

## 2017-04-01 DIAGNOSIS — R2689 Other abnormalities of gait and mobility: Secondary | ICD-10-CM

## 2017-04-01 DIAGNOSIS — M25562 Pain in left knee: Secondary | ICD-10-CM

## 2017-04-01 NOTE — Therapy (Signed)
[facility] [address]  [address] [place], [place], [address] Phone: [phone]   Fax:  [phone]  Physical Therapy Treatment  Patient Details  Name: Alice [name] MRN: [id number] Date of Birth: May 06, 1976 Referring Provider: Myrene Galas, MD  Encounter Date: 04/01/2017      PT End of Session - 04/01/17 1530    Visit Number 5   Number of Visits 26   Date for PT Re-Evaluation 06/14/17   Authorization Type UHC (benefit period July - June)   Authorization - Number of Visits 27  3 visits used with [facility] PT   PT Start Time 1530   PT Stop Time 1631   PT Time Calculation (min) 61 min   Equipment Utilized During Treatment --   Activity Tolerance Patient tolerated treatment well   Behavior During Therapy [facility] for tasks assessed/performed      Past Medical History:  Diagnosis Date  . ADHD (attention deficit hyperactivity disorder)   . Avascular necrosis of bone of hip (HCC)    RIGHT GRAFT  . Bipolar II disorder (HCC)   . Depression   . Nicotine dependence 03/06/2017  . PONV (postoperative nausea and vomiting)   . Wears glasses     Past Surgical History:  Procedure Laterality Date  . APPENDECTOMY  2000  . BREAST SURGERY  06/2009   RIGHT BREAST BIOPSY-NEGATIVE  . DILATION AND CURETTAGE OF UTERUS    . FASCIOTOMY Left 03/04/2017   Procedure: ANTERIOR COMPARTMENT FASCIOTOMY;  Surgeon: Myrene Galas, MD;  Location: [facility] OR;  Service: Orthopedics;  Laterality: Left;  . HIP ARTHROSCOPY  2008, 2010   AVASCULAR NECROSIS--RIGHT HIP 2010  . KNEE SURGERY  1995   ACL  . ORIF TIBIA PLATEAU Left 03/04/2017   Procedure: OPEN REDUCTION INTERNAL FIXATION (ORIF) TIBIAL PLATEAU;  Surgeon: Myrene Galas, MD;  Location: [facility] OR;  Service: Orthopedics;  Laterality: Left;  . SHOULDER SURGERY  [phone]  . TONSILLECTOMY    . WISDOM TOOTH EXTRACTION      There were no vitals filed for this visit.      Subjective Assessment -  04/01/17 1535    Subjective PT doing well. No new c/o.   Pertinent History 03/04/17 - L bicondylar tibial plateau fracture s/p ORIF, L lateral meniscus tear s/p repair & L anterior compartment fasciotomy; 1995 - L ACL tear & tibial plateau fracture s/p surgical repair & MUA; R tibial plateau fracture; 2010 - R THA d/t avascular necrosis   Patient Stated Goals "get it to bend"   Currently in Pain? No/denies   Pain Score 0-No pain            [facility] PT Assessment - 04/01/17 1530      PROM   Left Knee Extension 0   Left Knee Flexion 62                     [facility] Adult PT Treatment/Exercise - 04/01/17 1530      Exercises   Exercises Knee/Hip     Knee/Hip Exercises: Stretches   Quad Stretch Left;30 seconds;3 reps   Lobbyist Limitations prone - 1st rep: neutral  hip, 2nd & 3rd reps: hip extended with knee on pillow     Knee/Hip Exercises: Aerobic   Nustep lvl 3 x 6'     Knee/Hip Exercises: Supine   Heel Slides Left;AAROM;20 reps   Heel Slides Limitations longstitting with strap & towel under foot + manual inf pattellar  mobs by PT   Terminal Knee Extension Left;10 reps   Terminal Knee Extension Limitations 5" hold - TKE with small ball under knee   Patellar Mobs all directions   Knee Flexion Left;AAROM;20 reps   Knee Flexion Limitations heels on peanut ball; PT assist for slight overpressure     Modalities   Modalities Vasopneumatic     Vasopneumatic   Number Minutes Vasopneumatic  10 minutes   Vasopnuematic Location  Knee   Vasopneumatic Pressure High   Vasopneumatic Temperature  lowest     Manual Therapy   Manual Therapy Soft tissue mobilization;Myofascial release;Passive ROM;Taping   Joint Mobilization L knee - patellar mobs all directions; grade II-III A/P mobs in hooklying and sitting for flexion ROM   Soft tissue mobilization STM & strumming to L quads & ITB   Myofascial Release TPR to L RF/VI & VL   Passive ROM L knee flexion & extension to tolerance    Kinesiotex Inhibit Muscle     Kinesiotix   Inhibit Muscle  L quads - 3 30-50% "I" strips to VL, RF/VI & VM (graduated tension - 50% lateral to 30 % medial)          Trigger Point Dry Needling - 04/01/17 1530    Consent Given? Yes   Education Handout Provided Yes   Muscles Treated Lower Body Quadriceps   Quadriceps Response Twitch response elicited;Palpable increased muscle length  Lt VL & RF/VI              PT Education - 04/01/17 1540    Education provided Yes   Education Details Role of DN, expected response to treatment, recommended post-needling activity   Person(s) Educated Patient   Methods Explanation;Demonstration;Handout   Comprehension Verbalized understanding          PT Short Term Goals - 03/23/17 0804      PT SHORT TERM GOAL #1   Title Independent with initial HEP   Status On-going     PT SHORT TERM GOAL #2   Title L knee AROM 0-80 dg    Status On-going           PT Long Term Goals - 03/23/17 0818      PT LONG TERM GOAL #1   Title Independent with ongoing HEP   Status On-going     PT LONG TERM GOAL #2   Title L knee AROM 0-120    Status On-going     PT LONG TERM GOAL #3   Title L hip and knee strength >/= 4/5   Status On-going     PT LONG TERM GOAL #4   Title Pt will ambulate with normal gait pattern w/o AD on level surfaces   Status On-going     PT LONG TERM GOAL #5   Title Pt will ascend/descend a flight of stairs with good reciprocal pattern to allow increased ease of access to 2nd floor apt   Status On-going               Plan - 04/01/17 1537    Clinical Impression Statement ROM gradually improving with PROM L knee flexion up to 62 dg today. DN initiated to L mid & lateral quads following pt education and consent for treatment with positive twitch response elicited and pt noting improved tolerance for stretching and ROM exercises following DN. Kinesiotape applied to L quads to promote further muscle relaxation.     Rehab Potential Good   Clinical Impairments Affecting Rehab Potential h/o  L ACL tear & tibial plateau fracture s/p surgical repair & MUA; R tibial plateau fracture; 2010 - R THA d/t avascular necrosis   PT Treatment/Interventions Patient/family education;Manual techniques;Passive range of motion;Scar mobilization;Dry needling;Taping;Therapeutic exercise;Neuromuscular re-education;Therapeutic activities;Functional mobility training;Gait training;Stair training;Moist Heat;Electrical Stimulation;Cryotherapy;Vasopneumatic Device;Ultrasound;Iontophoresis 4mg /ml Dexamethasone;ADLs/Self Care Home Management   Consulted and Agree with Plan of Care Patient      Patient will benefit from skilled therapeutic intervention in order to improve the following deficits and impairments:  Pain, Decreased range of motion, Impaired flexibility, Increased muscle spasms, Increased fascial restricitons, Decreased strength, Abnormal gait, Difficulty walking, Decreased activity tolerance, Decreased balance, Decreased scar mobility  Visit Diagnosis: Stiffness of left knee, not elsewhere classified  Acute pain of left knee  Difficulty in walking, not elsewhere classified  Other abnormalities of gait and mobility     Problem List Patient Active Problem List   Diagnosis Date Noted  . Nicotine dependence 03/06/2017  . Bipolar II disorder (HCC)   . Closed bicondylar fracture of left tibial plateau 03/04/2017  . Avascular necrosis of bone of hip (HCC) 01/14/2017  . Chronic hip pain 01/14/2017  . ADD (attention deficit disorder) 11/10/2016  . 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 Personal history of hyperthyroidism 07/16/2011  . Knee pain 05/06/2011    Marry Guan, PT, MPT 04/01/2017, 4:49 PM  [facility] [address]  [address] [place], [place], [address] Phone: [phone]   Fax:  [phone]  Name: Alice [name] MRN: [id number] Date of  Birth: [birth year]

## 2017-04-01 NOTE — Telephone Encounter (Signed)
She is on the focalin and provigil. I tend to not mix dextroamphetamines like adderall with methylphenidates like focalin.  We can increase focalin, or add a low dose of immediate release ritalin if she would like to try that

## 2017-04-01 NOTE — Patient Instructions (Signed)

## 2017-04-02 ENCOUNTER — Other Ambulatory Visit (HOSPITAL_COMMUNITY): Payer: Self-pay | Admitting: Psychiatry

## 2017-04-02 ENCOUNTER — Ambulatory Visit: Payer: 59

## 2017-04-02 DIAGNOSIS — R2689 Other abnormalities of gait and mobility: Secondary | ICD-10-CM

## 2017-04-02 DIAGNOSIS — R262 Difficulty in walking, not elsewhere classified: Secondary | ICD-10-CM

## 2017-04-02 DIAGNOSIS — M25662 Stiffness of left knee, not elsewhere classified: Secondary | ICD-10-CM

## 2017-04-02 DIAGNOSIS — M25562 Pain in left knee: Secondary | ICD-10-CM

## 2017-04-02 MED ORDER — METHYLPHENIDATE HCL 10 MG PO TABS: 10.0000 mg | ORAL_TABLET | Freq: Every day | ORAL | 0 refills | Status: DC

## 2017-04-02 NOTE — Telephone Encounter (Signed)
Okay, I called patient and let her know that her prescription is ready to pick up.

## 2017-04-02 NOTE — Telephone Encounter (Signed)
We can do immediate release methylphenidate (generic focalin) 10 mg in the afternoon and see how this goes for her. I will write for 30 days, she is following up with me in 1 month

## 2017-04-02 NOTE — Telephone Encounter (Signed)
I called patient back and she is willing to do a higher dose of Focalin, she said if you could do an immediate release Focalin in the afternoon that may be better. Please advise, thank you

## 2017-04-02 NOTE — Therapy (Signed)
[facility] [address]  [address] [place], [place], [address] Phone: [phone]   Fax:  [phone]  Physical Therapy Treatment  Patient Details  Name: Alice CreaseMargaret M Fouty MRN: [id number] Date of Birth: November 29, 1975 Referring Provider: Myrene GalasMichael Handy, MD  Encounter Date: 04/02/2017      PT End of Session - 04/02/17 0846    Visit Number 6   Number of Visits 26   Date for PT Re-Evaluation 06/14/17   Authorization Type UHC (benefit period July - June)   Authorization - Number of Visits 27  3 [facility] with [facility]   PT Start Time [phone]   PT Stop Time 0940   PT Time Calculation (min) 58 min   Activity Tolerance Patient tolerated treatment well   Behavior [facility] Therapy [facility] for tasks assessed/performed      Past Medical History:  Diagnosis Date  . ADHD (attention deficit hyperactivity disorder)   . Avascular necrosis of bone of hip (HCC)    RIGHT GRAFT  . Bipolar II disorder (HCC)   . Depression   . Nicotine dependence 03/06/2017  . PONV (postoperative nausea and vomiting)   . Wears glasses     Past Surgical History:  Procedure Laterality Date  . APPENDECTOMY  2000  . BREAST SURGERY  06/2009   RIGHT BREAST BIOPSY-NEGATIVE  . DILATION AND CURETTAGE OF UTERUS    . FASCIOTOMY Left 03/04/2017   Procedure: ANTERIOR COMPARTMENT FASCIOTOMY;  Surgeon: Myrene GalasHandy, Michael, MD;  [facility];  Service: Orthopedics;  Laterality: Left;  . HIP ARTHROSCOPY  2008, 2010   AVASCULAR NECROSIS--RIGHT HIP 2010  . KNEE SURGERY  1995   ACL  . ORIF TIBIA PLATEAU Left 03/04/2017   Procedure: OPEN REDUCTION INTERNAL FIXATION (ORIF) TIBIAL PLATEAU;  Surgeon: Myrene GalasHandy, Michael, MD;  Location: [facility] OR;  Service: Orthopedics;  Laterality: Left;  . SHOULDER SURGERY  [phone],2003.2008  . TONSILLECTOMY    . WISDOM TOOTH EXTRACTION      There were no vitals filed for this visit.      Subjective Assessment - 04/02/17 0841    Subjective Pt. noting  "feelings" of tightness "in quad" this morning however doing well overall.     Patient Stated Goals "get it to bend"   Currently in Pain? Yes   Pain Score 2    Pain Location Tibia   Pain Orientation Left;Upper   Pain Descriptors / Indicators Throbbing   Pain Type Acute pain;Surgical pain   Aggravating Factors  exercises   Pain Relieving Factors Ice    Multiple Pain Sites No            [facility] PT Assessment - 04/01/17 1530      PROM   Left Knee Extension 0   Left Knee Flexion 62                     [facility] Adult PT Treatment/Exercise - 04/02/17 0856      Knee/Hip Exercises: Stretches   Quad Stretch Left;30 seconds;3 reps   LobbyistQuad Stretch Limitations prone - bolster under thigh    Hip Flexor Stretch Left;60 seconds   Hip Flexor Stretch Limitations mod thomas with LE on 6" step with strap + manual strumming to L quad    Other Knee/Hip Stretches 3-way quad foam roller x 1 min each way     Knee/Hip Exercises: Aerobic   Nustep lvl 3 x 6'     Knee/Hip Exercises: Supine   Heel  Slides Left;AAROM;20 reps   Heel Slides Limitations longstitting with strap & towel under foot    Terminal Knee Extension Left;10 reps   Terminal Knee Extension Limitations 5" hold - TKE with small ball under knee   Patellar Mobs all directions   Knee Flexion Left;AAROM;20 reps   Knee Flexion Limitations heels on peanut ball; therapist assist for slight overpressure     Modalities   Modalities Vasopneumatic     Vasopneumatic   Number Minutes Vasopneumatic  10 minutes   Vasopnuematic Location  Knee   Vasopneumatic Pressure High   Vasopneumatic Temperature  lowest     Manual Therapy   Manual Therapy Soft tissue mobilization;Myofascial release;Passive ROM   Joint Mobilization L knee - patellar mobs all directions; grade II-III A/P mobs in hooklying and sitting for flexion ROM   Soft tissue mobilization STM & strumming to L quads & ITB   Myofascial Release TPR to L RF/VI & VL   Passive ROM  L knee flexion & extension to tolerance                  PT Short Term Goals - 03/23/17 0804      PT SHORT TERM GOAL #1   Title Independent with initial HEP   Status On-going     PT SHORT TERM GOAL #2   Title L knee AROM 0-80 dg    Status On-going           PT Long Term Goals - 03/23/17 0818      PT LONG TERM GOAL #1   Title Independent with ongoing HEP   Status On-going     PT LONG TERM GOAL #2   Title L knee AROM 0-120    Status On-going     PT LONG TERM GOAL #3   Title L hip and knee strength >/= 4/5   Status On-going     PT LONG TERM GOAL #4   Title Pt will ambulate with normal gait pattern w/o AD on level surfaces   Status On-going     PT LONG TERM GOAL #5   Title Pt will ascend/descend a flight of stairs with good reciprocal pattern to allow increased ease of access to 2nd floor apt   Status On-going               Plan - 04/02/17 0846    Clinical Impression Statement Meleena doing well today reporting no significant soreness following DN.  Tolerated all ROM and gentle strengthening activities well today.  Prone foam rolling for quad initiated today to improve tenderness over quads/ITB.  Treatment ending with ice/compression as pt. still with visible swelling following treatment.    Clinical Impairments Affecting Rehab Potential h/o L ACL tear & tibial plateau fracture s/p surgical repair & MUA; R tibial plateau fracture; 2010 - R THA d/t avascular necrosis   PT Treatment/Interventions Patient/family education;Manual techniques;Passive range of motion;Scar mobilization;Dry needling;Taping;Therapeutic exercise;Neuromuscular re-education;Therapeutic activities;Functional mobility training;Gait training;Stair training;Moist Heat;Electrical Stimulation;Cryotherapy;Vasopneumatic Device;Ultrasound;Iontophoresis 4mg /ml Dexamethasone;ADLs/Self Care Home Management      Patient will benefit from skilled therapeutic intervention in order to improve the  following deficits and impairments:  Pain, Decreased range of motion, Impaired flexibility, Increased muscle spasms, Increased fascial restricitons, Decreased strength, Abnormal gait, Difficulty walking, Decreased activity tolerance, Decreased balance, Decreased scar mobility  Visit Diagnosis: Stiffness of left knee, not elsewhere classified  Acute pain of left knee  Difficulty in walking, not elsewhere classified  Other abnormalities of gait and mobility  Problem List Patient Active Problem List   Diagnosis Date Noted  . Nicotine dependence 03/06/2017  . Bipolar II disorder (HCC)   . Closed bicondylar fracture of left tibial plateau 03/04/2017  . Avascular necrosis of bone of hip (HCC) 01/14/2017  . Chronic hip pain 01/14/2017  . ADD (attention deficit disorder) 11/10/2016  . 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 Personal history of hyperthyroidism 07/16/2011  . Knee pain 05/06/2011    Kermit Balo, PTA 04/02/17 12:41 PM  [facility] [address]  [address] [place], [place], [address] Phone: [phone]   Fax:  [phone]  Name: JUNIE AVILLA MRN: [id number] Date of Birth: 28-May-1976

## 2017-04-05 ENCOUNTER — Ambulatory Visit: Payer: 59 | Admitting: Physical Therapy

## 2017-04-05 ENCOUNTER — Telehealth (HOSPITAL_COMMUNITY): Payer: Self-pay | Admitting: Psychiatry

## 2017-04-05 NOTE — Telephone Encounter (Signed)
Patient picked up Rx. Identification Verified.  DL 3: [id number].

## 2017-04-07 ENCOUNTER — Ambulatory Visit: Payer: 59 | Admitting: Physical Therapy

## 2017-04-07 DIAGNOSIS — R262 Difficulty in walking, not elsewhere classified: Secondary | ICD-10-CM

## 2017-04-07 DIAGNOSIS — M25562 Pain in left knee: Secondary | ICD-10-CM

## 2017-04-07 DIAGNOSIS — M25662 Stiffness of left knee, not elsewhere classified: Secondary | ICD-10-CM | POA: Diagnosis not present

## 2017-04-07 DIAGNOSIS — R2689 Other abnormalities of gait and mobility: Secondary | ICD-10-CM

## 2017-04-07 NOTE — Therapy (Addendum)
[facility] [address]  [address] [place], [place], [address] Phone: [phone]   Fax:  [phone]  Physical Therapy Treatment  Patient Details  Name: Alice [name] MRN: [id number] Date of Birth: [birth year] Referring Provider: Myrene Galas, MD  Encounter Date: 04/07/2017      PT End of Session - 04/07/17 0804    Visit Number 7   Number of Visits 26   Date for PT Re-Evaluation 06/14/17   Authorization Type UHC (benefit period July - June)   Authorization - Number of Visits 27  VL 30 - 3 visits used with [facility] PT   PT Start Time 0804   PT Stop Time 0857   PT Time Calculation (min) 53 min   Activity Tolerance Patient tolerated treatment well   Behavior During Therapy [facility] for tasks assessed/performed      Past Medical History:  Diagnosis Date  . ADHD (attention deficit hyperactivity disorder)   . Avascular necrosis of bone of hip (HCC)    RIGHT GRAFT  . Bipolar II disorder (HCC)   . Depression   . Nicotine dependence 03/06/2017  . PONV (postoperative nausea and vomiting)   . Wears glasses     Past Surgical History:  Procedure Laterality Date  . APPENDECTOMY  2000  . BREAST SURGERY  06/2009   RIGHT BREAST BIOPSY-NEGATIVE  . DILATION AND CURETTAGE OF UTERUS    . FASCIOTOMY Left 03/04/2017   Procedure: ANTERIOR COMPARTMENT FASCIOTOMY;  Surgeon: Myrene Galas, MD;  Location: [facility];  Service: Orthopedics;  Laterality: Left;  . HIP ARTHROSCOPY  2008, 2010   AVASCULAR NECROSIS--RIGHT HIP 2010  . KNEE SURGERY  1995   ACL  . ORIF TIBIA PLATEAU Left 03/04/2017   Procedure: OPEN REDUCTION INTERNAL FIXATION (ORIF) TIBIAL PLATEAU;  Surgeon: Myrene Galas, MD;  Location: [facility] OR;  Service: Orthopedics;  Laterality: Left;  . SHOULDER SURGERY  [phone]  . TONSILLECTOMY    . WISDOM TOOTH EXTRACTION      There were no vitals filed for this visit.      Subjective Assessment - 04/07/17 0811    Subjective Pt  reporting pain in her L knee when she stretches in her sleep.   Pertinent History 03/04/17 - L bicondylar tibial plateau fracture s/p ORIF, L lateral meniscus tear s/p repair & L anterior compartment fasciotomy; 1995 - L ACL tear & tibial plateau fracture s/p surgical repair & MUA; R tibial plateau fracture; 2010 - R THA d/t avascular necrosis   Patient Stated Goals "get it to bend"   Currently in Pain? No/denies   Pain Score 0-No pain            [facility] PT Assessment - 04/07/17 0804      Assessment   Next MD Visit 04/14/17                     [facility] Adult PT Treatment/Exercise - 04/07/17 0804      Exercises   Exercises Knee/Hip     Knee/Hip Exercises: Stretches   Quad Stretch Left;30 seconds;2 reps   Quad Stretch Limitations prone - bolster under thigh      Knee/Hip Exercises: Aerobic   Recumbent Bike rocking for ROM x 6'     Knee/Hip Exercises: Seated   Other Seated Knee/Hip Exercises L Fitter leg press (1 blue) x15; AAROM into flexion on quad relaxation     Knee/Hip Exercises: Supine   Straight Leg Raises Left;10 reps;Strengthening  Straight Leg Raises Limitations 2#   Straight Leg Raise with External Rotation Left;10 reps;AROM   Knee Flexion Left;AAROM;20 reps   Knee Flexion Limitations heels on peanut ball; PT assist for slight overpressure     Knee/Hip Exercises: Sidelying   Hip ABduction Left;10 reps;AROM   Hip ADduction Left;10 reps;AAROM     Knee/Hip Exercises: Prone   Hip Extension Left;10 reps;AAROM     Modalities   Modalities Vasopneumatic     Vasopneumatic   Number Minutes Vasopneumatic  10 minutes   Vasopnuematic Location  Knee   Vasopneumatic Pressure High   Vasopneumatic Temperature  lowest     Manual Therapy   Manual Therapy Joint mobilization;Passive ROM   Joint Mobilization L knee - patellar mobs all directions; grade II-III A/P mobs in hooklying and sitting for flexion ROM   Passive ROM L knee flexion & extension to tolerance                 PT Education - 04/07/17 0845    Education provided Yes   Education Details HEP addition - 4 way SLR, HS curl & hip adduction squeeze   Person(s) Educated Patient   Methods Explanation;Demonstration;Handout   Comprehension Verbalized understanding;Returned demonstration          PT Short Term Goals - 03/23/17 0804      PT SHORT TERM GOAL #1   Title Independent with initial HEP   Status On-going     PT SHORT TERM GOAL #2   Title L knee AROM 0-80 dg    Status On-going           PT Long Term Goals - 03/23/17 0818      PT LONG TERM GOAL #1   Title Independent with ongoing HEP   Status On-going     PT LONG TERM GOAL #2   Title L knee AROM 0-120    Status On-going     PT LONG TERM GOAL #3   Title L hip and knee strength >/= 4/5   Status On-going     PT LONG TERM GOAL #4   Title Pt will ambulate with normal gait pattern w/o AD on level surfaces   Status On-going     PT LONG TERM GOAL #5   Title Pt will ascend/descend a flight of stairs with good reciprocal pattern to allow increased ease of access to 2nd floor apt   Status On-going               Plan - 04/07/17 0813    Clinical Impression Statement Pt noting atrophy in her leg and looking forward to being able to weight bear again on her L leg. Reviewed open chain hip strengthening including 4 way SLR, with pt having greatest difficulty with hip adduction, therefore also added SLR + ER and hip adduction pillow squeeze to promote increased hip adductor activation. L knee ROM remains slow to progress.   Rehab Potential Good   Clinical Impairments Affecting Rehab Potential h/o L ACL tear & tibial plateau fracture s/p surgical repair & MUA; R tibial plateau fracture; 2010 - R THA d/t avascular necrosis   PT Treatment/Interventions Patient/family education;Manual techniques;Passive range of motion;Scar mobilization;Dry needling;Taping;Therapeutic exercise;Neuromuscular re-education;Therapeutic  activities;Functional mobility training;Gait training;Stair training;Moist Heat;Electrical Stimulation;Cryotherapy;Vasopneumatic Device;Ultrasound;Iontophoresis 4mg /ml Dexamethasone;ADLs/Self Care Home Management   Consulted and Agree with Plan of Care Patient      Patient will benefit from skilled therapeutic intervention in order to improve the following deficits and impairments:  Pain, Decreased range  of motion, Impaired flexibility, Increased muscle spasms, Increased fascial restricitons, Decreased strength, Abnormal gait, Difficulty walking, Decreased activity tolerance, Decreased balance, Decreased scar mobility  Visit Diagnosis: Stiffness of left knee, not elsewhere classified  Acute pain of left knee  Difficulty in walking, not elsewhere classified  Other abnormalities of gait and mobility     Problem List Patient Active Problem List   Diagnosis Date Noted  . Nicotine dependence 03/06/2017  . Bipolar II disorder (HCC)   . Closed bicondylar fracture of left tibial plateau 03/04/2017  . Avascular necrosis of bone of hip (HCC) 01/14/2017  . Chronic hip pain 01/14/2017  . ADD (attention deficit disorder) 11/10/2016  . 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 Personal history of hyperthyroidism 07/16/2011  . Knee pain 05/06/2011    Marry GuanJoAnne M Clell Trahan, PT, MPT 04/07/2017, 9:47 AM  [facility] [address]  [address] [place], [place], [address] Phone: [phone]   Fax:  [phone]  Name: Alice [name] MRN: [id number] Date of Birth: [birth year]

## 2017-04-09 ENCOUNTER — Ambulatory Visit: Payer: 59

## 2017-04-09 DIAGNOSIS — R262 Difficulty in walking, not elsewhere classified: Secondary | ICD-10-CM

## 2017-04-09 DIAGNOSIS — M25562 Pain in left knee: Secondary | ICD-10-CM

## 2017-04-09 DIAGNOSIS — M25662 Stiffness of left knee, not elsewhere classified: Secondary | ICD-10-CM | POA: Diagnosis not present

## 2017-04-09 DIAGNOSIS — R2689 Other abnormalities of gait and mobility: Secondary | ICD-10-CM

## 2017-04-09 NOTE — Therapy (Signed)
[facility] [place] [address]  [address] [place], [place], [address] Phone: [phone]   Fax:  [phone]  Physical Therapy Treatment  Patient Details  Name: Alice CreaseMargaret M Caskey MRN: [id number] Date of Birth: 09-11-1975 Referring Provider: Myrene GalasMichael Handy, MD  Encounter Date: 04/09/2017      PT End of Session - 04/09/17 0934    Visit Number 8   Number of Visits 26   Date for PT Re-Evaluation 06/14/17   Authorization Type UHC (benefit period July - June)   Authorization - Number of Visits 27  VL 30 - [facility]   PT Start Time 0931   PT Stop Time 1010   PT Time Calculation (min) 39 min   Activity Tolerance Patient tolerated treatment well   Behavior During Therapy [facility] for tasks assessed/performed      Past Medical History:  Diagnosis Date  . ADHD (attention deficit hyperactivity disorder)   . Avascular necrosis of bone of hip (HCC)    RIGHT GRAFT  . Bipolar II disorder (HCC)   . Depression   . Nicotine dependence 03/06/2017  . PONV (postoperative nausea and vomiting)   . Wears glasses     Past Surgical History:  Procedure Laterality Date  . APPENDECTOMY  2000  . BREAST SURGERY  06/2009   RIGHT BREAST BIOPSY-NEGATIVE  . DILATION AND CURETTAGE OF UTERUS    . FASCIOTOMY Left 03/04/2017   Procedure: ANTERIOR COMPARTMENT FASCIOTOMY;  Surgeon: Myrene GalasHandy, Michael, MD;  [facility];  Service: Orthopedics;  Laterality: Left;  . HIP ARTHROSCOPY  2008, 2010   AVASCULAR NECROSIS--RIGHT HIP 2010  . KNEE SURGERY  1995   ACL  . ORIF TIBIA PLATEAU Left 03/04/2017   Procedure: OPEN REDUCTION INTERNAL FIXATION (ORIF) TIBIAL PLATEAU;  Surgeon: Myrene GalasHandy, Michael, MD;  Location[facility] OR;  Service: Orthopedics;  Laterality: Left;  . SHOULDER SURGERY  [phone] [id number]  . TONSILLECTOMY    . WISDOM TOOTH EXTRACTION      There were no vitals filed for this visit.      Subjective Assessment - 04/09/17 0933    Subjective Pt.  doing well today no new complaints.  Unsure if she is doing sidelying SLR HEP correctly requesting review.      Patient Stated Goals "get it to bend"   Currently in Pain? No/denies   Pain Score 0-No pain   Multiple Pain Sites No            [facility] PT Assessment - 04/09/17 1006      PROM   PROM Assessment Site Knee   Right/Left Knee Left   Left Knee Extension 0   Left Knee Flexion 71                     [facility] Adult PT Treatment/Exercise - 04/09/17 0948      Knee/Hip Exercises: Stretches   Quad Stretch Left;1 rep;60 seconds   Quad Stretch Limitations prone - bolster under thigh    Hip Flexor Stretch Left;60 seconds   Hip Flexor Stretch Limitations mod thomas with LE on floor    Other Knee/Hip Stretches 3-way quad foam roller x 1 min each way     Knee/Hip Exercises: Aerobic   Nustep lvl 4 x 6'     Knee/Hip Exercises: Supine   Straight Leg Raises Left;10 reps;Strengthening   Straight Leg Raises Limitations ~ 5 dg quad lag    Straight Leg Raise with External Rotation Left;10  reps;AROM   Patellar Mobs all directions   Knee Flexion Left;AAROM;20 reps   Knee Flexion Limitations heels on peanut ball; therapist assist for overpressure     Knee/Hip Exercises: Sidelying   Hip ADduction Left;10 reps;AAROM   Hip ADduction Limitations required therapist assist for motion      Knee/Hip Exercises: Prone   Hip Extension Left;10 reps;AROM;AAROM   Hip Extension Limitations AAROM with therapist x last 5 reps      Manual Therapy   Manual Therapy Joint mobilization;Passive ROM   Joint Mobilization L knee - patellar mobs all directions; grade II-III A/P mobs in hooklying and sitting for flexion ROM   Passive ROM L knee flexion & extension to tolerance                  PT Short Term Goals - 03/23/17 0804      PT SHORT TERM GOAL #1   Title Independent with initial HEP   Status On-going     PT SHORT TERM GOAL #2   Title L knee AROM 0-80 dg    Status On-going            PT Long Term Goals - 03/23/17 0818      PT LONG TERM GOAL #1   Title Independent with ongoing HEP   Status On-going     PT LONG TERM GOAL #2   Title L knee AROM 0-120    Status On-going     PT LONG TERM GOAL #3   Title L hip and knee strength >/= 4/5   Status On-going     PT LONG TERM GOAL #4   Title Pt will ambulate with normal gait pattern w/o AD on level surfaces   Status On-going     PT LONG TERM GOAL #5   Title Pt will ascend/descend a flight of stairs with good reciprocal pattern to allow increased ease of access to 2nd floor apt   Status On-going               Plan - 04/09/17 0938    Clinical Impression Statement [name] doing well this morning.  Able to demo improved PROM flexion to 71 dg today following quad stretching and patellar mobs.  Most difficulty with hip adduction, extension SLR today requiring some assist from therapist to perform in full ROM.  Had to leave treatment early due to work meeting thus skipped ice to end treatment due to time constraints.  Progressing toward goals well.     Clinical Impairments Affecting Rehab Potential h/o L ACL tear & tibial plateau fracture s/p surgical repair & MUA; R tibial plateau fracture; 2010 - R THA d/t avascular necrosis   PT Treatment/Interventions Patient/family education;Manual techniques;Passive range of motion;Scar mobilization;Dry needling;Taping;Therapeutic exercise;Neuromuscular re-education;Therapeutic activities;Functional mobility training;Gait training;Stair training;Moist Heat;Electrical Stimulation;Cryotherapy;Vasopneumatic Device;Ultrasound;Iontophoresis 4mg /ml Dexamethasone;ADLs/Self Care Home Management      Patient will benefit from skilled therapeutic intervention in order to improve the following deficits and impairments:  Pain, Decreased range of motion, Impaired flexibility, Increased muscle spasms, Increased fascial restricitons, Decreased strength, Abnormal gait, Difficulty walking,  Decreased activity tolerance, Decreased balance, Decreased scar mobility  Visit Diagnosis: Stiffness of left knee, not elsewhere classified  Acute pain of left knee  Difficulty in walking, not elsewhere classified  Other abnormalities of gait and mobility     Problem List Patient Active Problem List   Diagnosis Date Noted  . Nicotine dependence 03/06/2017  . Bipolar II disorder (HCC)   . Closed bicondylar fracture of  left tibial plateau 03/04/2017  . Avascular necrosis of bone of hip (HCC) 01/14/2017  . Chronic hip pain 01/14/2017  . ADD (attention deficit disorder) 11/10/2016  . 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 Personal history of hyperthyroidism 07/16/2011  . Knee pain 05/06/2011   Kermit Balo, PTA 04/09/17 1:41 PM  [facility] [address]  [address] [place], [place], [address] Phone: [phone]   Fax:  [phone]  Name: CATRINIA RACICOT MRN: [id number] Date of Birth: 04/09/1976

## 2017-04-13 ENCOUNTER — Ambulatory Visit: Payer: 59 | Admitting: Physical Therapy

## 2017-04-13 DIAGNOSIS — R262 Difficulty in walking, not elsewhere classified: Secondary | ICD-10-CM

## 2017-04-13 DIAGNOSIS — M25662 Stiffness of left knee, not elsewhere classified: Secondary | ICD-10-CM | POA: Diagnosis not present

## 2017-04-13 DIAGNOSIS — M25562 Pain in left knee: Secondary | ICD-10-CM

## 2017-04-13 DIAGNOSIS — R2689 Other abnormalities of gait and mobility: Secondary | ICD-10-CM

## 2017-04-13 MED FILL — DEXMETHYLPHENIDATE ER 30 MG: 30 | 30 days supply | Qty: 30 | Fill #0

## 2017-04-13 NOTE — Therapy (Signed)
[facility] [place] [address]  [place], [place], [address] Phone: [phone]   Fax:  [phone]  Physical Therapy Treatment  Patient Details  Name: Alice [name] MRN: [id number] Date of Birth: May 13, 1976 Referring Provider: Altamese Rockford, MD  Encounter Date: 04/13/2017      PT End of Session - 04/13/17 0800    Visit Number 9   Number of Visits 26   Date for PT Re-Evaluation 06/14/17   Authorization Type UHC (benefit period July - June)   Authorization - Number of Visits 27  VL 30 - 3 visits used with [facility] PT   PT Start Time 0800   PT Stop Time 0902   PT Time Calculation (min) 62 min   Activity Tolerance Patient tolerated treatment well   Behavior During Therapy [facility] for tasks assessed/performed      Past Medical History:  Diagnosis Date  . ADHD (attention deficit hyperactivity disorder)   . Avascular necrosis of bone of hip (HCC)    RIGHT GRAFT  . Bipolar II disorder ([place])   . Depression   . Nicotine dependence 03/06/2017  . PONV (postoperative nausea and vomiting)   . Wears glasses     Past Surgical History:  Procedure Laterality Date  . APPENDECTOMY  2000  . BREAST SURGERY  06/2009   RIGHT BREAST BIOPSY-NEGATIVE  . DILATION AND CURETTAGE OF UTERUS    . FASCIOTOMY Left 03/04/2017   Procedure: ANTERIOR COMPARTMENT FASCIOTOMY;  Surgeon: Altamese Isola, MD;  Location: [place];  Service: Orthopedics;  Laterality: Left;  . HIP ARTHROSCOPY  2008, 2010   AVASCULAR NECROSIS--RIGHT HIP 2010  . KNEE SURGERY  1995   ACL  . ORIF TIBIA PLATEAU Left 03/04/2017   Procedure: OPEN REDUCTION INTERNAL FIXATION (ORIF) TIBIAL PLATEAU;  Surgeon: Altamese Dolton, MD;  Location: [place];  Service: Orthopedics;  Laterality: Left;  . SHOULDER SURGERY  [phone]  . TONSILLECTOMY    . WISDOM TOOTH EXTRACTION      There were no vitals filed for this visit.      Subjective Assessment - 04/13/17 0805    Subjective Pt  reporting knee feels a littel stiffer today. Wonders if it the weather.   Pertinent History 03/04/17 - L bicondylar tibial plateau fracture s/p ORIF, L lateral meniscus tear s/p repair & L anterior compartment fasciotomy; 1995 - L ACL tear & tibial plateau fracture s/p surgical repair & MUA; R tibial plateau fracture; 2010 - R THA d/t avascular necrosis   Patient Stated Goals "get it to bend"   Currently in Pain? Yes   Pain Score 1    Pain Location Tibia   Pain Orientation Left;Upper   Pain Descriptors / Indicators Sore   Pain Type Acute pain;Surgical pain   Pain Onset More than a month ago   Pain Frequency Intermittent   Aggravating Factors  end range flexion; movement during sleep   Pain Relieving Factors ice; Alieve PRN            [facility] PT Assessment - 04/13/17 0800      Assessment   Medical Diagnosis L bicondylar tibial plateau fracture s/p ORIF, L lateral meniscus tear s/p repair & L anterior compartment fasciotomy   Referring Provider Altamese [place], MD   Onset Date/Surgical Date 03/04/17  date of injury - 02/22/17, surgery - 03/04/17   Next MD Visit 04/14/17     Restrictions   LLE Weight Bearing Non weight bearing     AROM  Left Knee Extension 0   Left Knee Flexion 75                     [facility] Adult PT Treatment/Exercise - 04/13/17 0800      Exercises   Exercises Knee/Hip     Knee/Hip Exercises: Aerobic   Nustep lvl 3 x 6' - AAROM for knee flexion ROM      Knee/Hip Exercises: Seated   Ball Squeeze 10 x 5"   Clamshell with TheraBand Red  10 x 3" alt hip ABD/ER   Other Seated Knee/Hip Exercises L Fitter leg press (1 blue) x15; AAROM into flexion on quad relaxation   Other Seated Knee/Hip Exercises Seated ant/post rocks on green Pball with L foot planted to promote increased knee flexion ROM x15   Marching Both;10 reps   Marching Limitations looped red TB at knees     Knee/Hip Exercises: Supine   Patellar Mobs pt instructed in self L knee inferior  patellar mobs     Modalities   Modalities Vasopneumatic     Vasopneumatic   Number Minutes Vasopneumatic  10 minutes   Vasopnuematic Location  Knee   Vasopneumatic Pressure High   Vasopneumatic Temperature  lowest     Manual Therapy   Manual Therapy Joint mobilization   Joint Mobilization seated L knee distraction with grade II-III tibia on femur P/A mobs for increased flexion                PT Education - 04/13/17 0902    Education provided Yes   Education Details HEP update - seated red TB exercises, self L knee inferior patellar mobs   Person(s) Educated Patient   Methods Explanation;Demonstration;Handout   Comprehension Verbalized understanding;Returned demonstration          PT Short Term Goals - 04/13/17 0808      PT SHORT TERM GOAL #1   Title Independent with initial HEP   Status Achieved     PT SHORT TERM GOAL #2   Title L knee AROM 0-80 dg    Status On-going           PT Long Term Goals - 03/23/17 0818      PT LONG TERM GOAL #1   Title Independent with ongoing HEP   Status On-going     PT LONG TERM GOAL #2   Title L knee AROM 0-120    Status On-going     PT LONG TERM GOAL #3   Title L hip and knee strength >/= 4/5   Status On-going     PT LONG TERM GOAL #4   Title Pt will ambulate with normal gait pattern w/o AD on level surfaces   Status On-going     PT LONG TERM GOAL #5   Title Pt will ascend/descend a flight of stairs with good reciprocal pattern to allow increased ease of access to 2nd floor apt   Status On-going               Plan - 04/13/17 0808    Clinical Impression Statement Ginny noting feeling of increased stiffness today, especially in posterior knee. Pain remains intermittent and minimal, worst with movement while sleeping and end range knee flexion activities. L knee ROM continues to slowly improve with current ROM 0-75 dg. Pt tolerating open chain strengthening but continues to demonstrate the greatest weakness  in L hip adduction and extension SLR's. STG #1 met and #2 nearly met. Donia Guiles continues to demonstrate  continued potential to benefit from skilled PT to restore L knee ROM as well as promote LE strengthening and progression to weight bearing acitvities and gait training per MD direction.   Rehab Potential Good   Clinical Impairments Affecting Rehab Potential h/o L ACL tear & tibial plateau fracture s/p surgical repair & MUA; R tibial plateau fracture; 2010 - R THA d/t avascular necrosis   PT Treatment/Interventions Patient/family education;Manual techniques;Passive range of motion;Scar mobilization;Dry needling;Taping;Therapeutic exercise;Neuromuscular re-education;Therapeutic activities;Functional mobility training;Gait training;Stair training;Moist Heat;Electrical Stimulation;Cryotherapy;Vasopneumatic Device;Ultrasound;Iontophoresis 31m/ml Dexamethasone;ADLs/Self Care Home Management   PT Next Visit Plan 10th visit FOTO   Consulted and Agree with Plan of Care Patient      Patient will benefit from skilled therapeutic intervention in order to improve the following deficits and impairments:  Pain, Decreased range of motion, Impaired flexibility, Increased muscle spasms, Increased fascial restricitons, Decreased strength, Abnormal gait, Difficulty walking, Decreased activity tolerance, Decreased balance, Decreased scar mobility  Visit Diagnosis: Stiffness of left knee, not elsewhere classified  Acute pain of left knee  Difficulty in walking, not elsewhere classified  Other abnormalities of gait and mobility     Problem List Patient Active Problem List   Diagnosis Date Noted  . Nicotine dependence 03/06/2017  . Bipolar II disorder ([place]   . Closed bicondylar fracture of left tibial plateau 03/04/2017  . [place] 01/14/2017  . Chronic hip pain 01/14/2017  . ADD (attention deficit disorder) 11/10/2016  . 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 Personal  history of hyperthyroidism 07/16/2011  . Knee pain 05/06/2011    JPercival Spanish PT, MPT 04/13/2017, 9:25 AM  [facility] [place] [address]Phone: [phone]  Fax:  [phone] Name: MZAMZAM WHINERYMRN: [id number] of Birth: [birth year]

## 2017-04-15 ENCOUNTER — Ambulatory Visit: Payer: 59

## 2017-04-16 ENCOUNTER — Ambulatory Visit: Payer: 59 | Attending: Orthopedic Surgery

## 2017-04-16 DIAGNOSIS — R262 Difficulty in walking, not elsewhere classified: Secondary | ICD-10-CM | POA: Insufficient documentation

## 2017-04-16 DIAGNOSIS — R2689 Other abnormalities of gait and mobility: Secondary | ICD-10-CM | POA: Insufficient documentation

## 2017-04-16 DIAGNOSIS — M25662 Stiffness of left knee, not elsewhere classified: Secondary | ICD-10-CM | POA: Diagnosis present

## 2017-04-16 DIAGNOSIS — M25562 Pain in left knee: Secondary | ICD-10-CM | POA: Insufficient documentation

## 2017-04-16 MED FILL — CIPROFLOXACIN HCL 500 MG TA: 500 | 5 days supply | Qty: 10 | Fill #0

## 2017-04-16 NOTE — Therapy (Signed)
[facility] [address]  [address] [place], [place], [address] Phone: [phone]   Fax:  [phone]  Physical Therapy Treatment  Patient Details  Name: Alice [name] MRN: [id number] Date of Birth: February 06, 1976 Referring Provider: Myrene Galas, MD  Encounter Date: 04/16/2017      PT End of Session - 04/16/17 1102    Visit Number 10   Number of Visits 26   Date for PT Re-Evaluation 06/14/17   Authorization Type UHC (benefit period July - June)   Authorization - Number of Visits 27  VL 30 - 3 visits used with [facility] PT   PT Start Time 1102   PT Stop Time 1152   PT Time Calculation (min) 50 min   Activity Tolerance Patient tolerated treatment well   Behavior During Therapy [facility] for tasks assessed/performed      Past Medical History:  Diagnosis Date  . ADHD (attention deficit hyperactivity disorder)   . Avascular necrosis of bone of hip (HCC)    RIGHT GRAFT  . Bipolar II disorder (HCC)   . Depression   . Nicotine dependence 03/06/2017  . PONV (postoperative nausea and vomiting)   . Wears glasses     Past Surgical History:  Procedure Laterality Date  . APPENDECTOMY  2000  . BREAST SURGERY  06/2009   RIGHT BREAST BIOPSY-NEGATIVE  . DILATION AND CURETTAGE OF UTERUS    . FASCIOTOMY Left 03/04/2017   Procedure: ANTERIOR COMPARTMENT FASCIOTOMY;  Surgeon: Myrene Galas, MD;  Location: [facility];  Service: Orthopedics;  Laterality: Left;  . HIP ARTHROSCOPY  2008, 2010   AVASCULAR NECROSIS--RIGHT HIP 2010  . KNEE SURGERY  1995   ACL  . ORIF TIBIA PLATEAU Left 03/04/2017   Procedure: OPEN REDUCTION INTERNAL FIXATION (ORIF) TIBIAL PLATEAU;  Surgeon: Myrene Galas, MD;  [facility];  Service: Orthopedics;  Laterality: Left;  . SHOULDER SURGERY  [phone]  . TONSILLECTOMY    . WISDOM TOOTH EXTRACTION      There were no vitals filed for this visit.      Subjective Assessment - 04/16/17 1106    Subjective Pt.  reporting, "I got a bad report from my doctors f/u".  Pt. reporting, "my doctor wanted my bending to be better and told me I shoulder be in the 90's".     Patient Stated Goals "get it to bend"   Currently in Pain? No/denies   Pain Score 0-No pain   Multiple Pain Sites No            [facility] PT Assessment - 04/16/17 1210      Observation/Other Assessments   Focus on Therapeutic Outcomes (FOTO)  knee 46% (54% limitation)                     [facility] Adult PT Treatment/Exercise - 04/16/17 1218      Self-Care   Self-Care Other Self-Care Comments   Other Self-Care Comments  Discussion and demonstration of TTWB status with crutches per pt. latest orders from MD f/u; demo of self-patellar mobs with cueing for focus on inferior mobs for increased flexion ROM      Exercises   Exercises Knee/Hip     Knee/Hip Exercises: Stretches   Quad Stretch Left;1 rep;60 seconds   Quad Stretch Limitations prone - bolster under thigh      Knee/Hip Exercises: Aerobic   Nustep lvl 3 x 4' - AAROM for knee flexion ROM   Seated Lvl on  7     Knee/Hip Exercises: Standing   Knee Flexion Left;10 reps   Knee Flexion Limitations 1#; holding onto chair    Hip Flexion Left;10 reps   Hip Flexion Limitations 1#; holding onto chair    Hip Abduction Left;10 reps;Knee straight   Abduction Limitations 1#; holding onto chair    Hip Extension Left;10 reps;Knee straight   Extension Limitations 1#; holding onto chair      Knee/Hip Exercises: Prone   Hamstring Curl 10 reps;3 seconds   Hamstring Curl Limitations 1#; cues to push to discomfort and not beyond this ROM     Manual Therapy   Manual Therapy Joint mobilization;Soft tissue mobilization   Joint Mobilization seated L knee distraction with grade III-IV tibia on femur P/A mobs (with seat belt assist) for increased flexion ROM; L patellar mobs all directions focusing on inferior mobs for improved flexion ROM    Soft tissue mobilization scar massage     Passive ROM L knee flexion & extension to tolerance                PT Education - 04/16/17 1211    Education provided Yes   Education Details Standing L hip flexion, abduction, extension, L HS curl (pt. has ankle weights at home and instructed to use 1# ankle weight when these activities become less challanging)   Person(s) Educated Patient   Methods Explanation;Demonstration;Verbal cues;Handout   Comprehension Verbalized understanding;Returned demonstration;Verbal cues required;Need further instruction          PT Short Term Goals - 04/13/17 [id number]      PT SHORT TERM GOAL #1   Title Independent with initial HEP   Status Achieved     PT SHORT TERM GOAL #2   Title L knee AROM 0-80 dg    Status On-going           PT Long Term Goals - 03/23/17 0818      PT LONG TERM GOAL #1   Title Independent with ongoing HEP   Status On-going     PT LONG TERM GOAL #2   Title L knee AROM 0-120    Status On-going     PT LONG TERM GOAL #3   Title L hip and knee strength >/= 4/5   Status On-going     PT LONG TERM GOAL #4   Title Pt will ambulate with normal gait pattern w/o AD on level surfaces   Status On-going     PT LONG TERM GOAL #5   Title Pt will ascend/descend a flight of stairs with good reciprocal pattern to allow increased ease of access to 2nd floor apt   Status On-going               Plan - 04/16/17 1103    Clinical Impression Statement Pt. seen today with new orders from recent MD f/u for TDWB status.  MD orders instructing 3x/wk frequency however pt. requesting therapy 2x/wk to conserve insurance authorization coverage for visits.  Some time spent today reviewing TDWB status with pt. able to verbalize/demo understanding with crutches.  Focus of today's visit was manual joint mobilizations with seat-belt assist, patellar mobilizations, and passive stretching for improved knee flexion ROM.  Pt. with good tolerance for this and verbalized that she is  performing daily patellar mobs (as previously instructed) as well as full HEP for increase ROM.  Some standing hip strengthening observing TDWB restrictions initiated today with pt. tolerating well.  HEP updated.  Pt. unable to  ice to end treatment today due to time constraints however left treatment pain free and plans to ice knee at work.  Pt. making good progress toward established goals.     Clinical Impairments Affecting Rehab Potential h/o L ACL tear & tibial plateau fracture s/p surgical repair & MUA; R tibial plateau fracture; 2010 - R THA d/t avascular necrosis   PT Treatment/Interventions Patient/family education;Manual techniques;Passive range of motion;Scar mobilization;Dry needling;Taping;Therapeutic exercise;Neuromuscular re-education;Therapeutic activities;Functional mobility training;Gait training;Stair training;Moist Heat;Electrical Stimulation;Cryotherapy;Vasopneumatic Device;Ultrasound;Iontophoresis 4mg /ml Dexamethasone;ADLs/Self Care Home Management   PT Next Visit Plan TDWB x 2 more weeks (recieved on 11.2.18) then progress to WBAT in brace; weaning out of brace in two more weeks       Patient will benefit from skilled therapeutic intervention in order to improve the following deficits and impairments:  Pain, Decreased range of motion, Impaired flexibility, Increased muscle spasms, Increased fascial restricitons, Decreased strength, Abnormal gait, Difficulty walking, Decreased activity tolerance, Decreased balance, Decreased scar mobility  Visit Diagnosis: Stiffness of left knee, not elsewhere classified  Acute pain of left knee  Difficulty in walking, not elsewhere classified  Other abnormalities of gait and mobility     Problem List Patient Active Problem List   Diagnosis Date Noted  . Nicotine dependence 03/06/2017  . Bipolar II disorder (HCC)   . Closed bicondylar fracture of left tibial plateau 03/04/2017  . Avascular necrosis of bone of hip (HCC) 01/14/2017  .  Chronic hip pain 01/14/2017  . ADD (attention deficit disorder) 11/10/2016  . 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 Personal history of hyperthyroidism 07/16/2011  . Knee pain 05/06/2011    Kermit Balo, PTA 04/16/17 12:55 PM  [facility] [address]  [address] [place], [place], [address] Phone: [phone]   Fax:  [phone]  Name: JAMIA HOBAN MRN: [id number] Date of Birth: Jun 06, 1976

## 2017-04-20 ENCOUNTER — Ambulatory Visit: Payer: 59 | Admitting: Physical Therapy

## 2017-04-20 DIAGNOSIS — M25662 Stiffness of left knee, not elsewhere classified: Secondary | ICD-10-CM

## 2017-04-20 DIAGNOSIS — R262 Difficulty in walking, not elsewhere classified: Secondary | ICD-10-CM

## 2017-04-20 DIAGNOSIS — R2689 Other abnormalities of gait and mobility: Secondary | ICD-10-CM

## 2017-04-20 DIAGNOSIS — M25562 Pain in left knee: Secondary | ICD-10-CM

## 2017-04-20 NOTE — Therapy (Signed)
[facility] [address]  [address] [place], [place], [address] Phone: [phone]   Fax:  [phone]  Physical Therapy Treatment  Patient Details  Name: Alice [name] MRN: [id number] Date of Birth: 02-15-1976 Referring Provider: Myrene GalasMichael Handy, MD   Encounter Date: 04/20/2017  PT End of Session - 04/20/17 0806    Visit Number  11    Number of Visits  26    Date for PT Re-Evaluation  06/14/17    Authorization Type  [employer] (benefit period July - June)    Authorization - Number of Visits  27 VL 30 - 3 visits used with HH PT   VL 30 - [facility]   PT Start Time  0806 pt arrived late   pt arrived late   PT Stop Time  0847    PT Time Calculation (min)  41 min    Activity Tolerance  Patient tolerated treatment well    Behavior During Therapy  [facility] for tasks assessed/performed       Past Medical History:  Diagnosis Date  . ADHD (attention deficit hyperactivity disorder)   . Avascular necrosis of bone of hip (HCC)    RIGHT GRAFT  . Bipolar II disorder (HCC)   . Depression   . Nicotine dependence 03/06/2017  . PONV (postoperative nausea and vomiting)   . Wears glasses     Past Surgical History:  Procedure Laterality Date  . APPENDECTOMY  2000  . BREAST SURGERY  06/2009   RIGHT BREAST BIOPSY-NEGATIVE  . DILATION AND CURETTAGE OF UTERUS    . HIP ARTHROSCOPY  2008, 2010   AVASCULAR NECROSIS--RIGHT HIP 2010  . KNEE SURGERY  1995   ACL  . SHOULDER SURGERY  [phone],2003.2008  . TONSILLECTOMY    . WISDOM TOOTH EXTRACTION      There were no vitals filed for this visit.  Subjective Assessment - 04/20/17 0809    Subjective  Pt reporting increased stiffness for the past 2 days - thinks it is weather related.    Pertinent History  03/04/17 - L bicondylar tibial plateau fracture s/p ORIF, L lateral meniscus tear s/p repair & L anterior compartment fasciotomy; 1995 - L ACL tear & tibial plateau fracture s/p surgical  repair & MUA; R tibial plateau fracture; 2010 - R THA d/t avascular necrosis    Patient Stated Goals  "get it to bend"    Currently in Pain?  No/denies    Pain Score  0-No pain                      [facility] Adult PT Treatment/Exercise - 04/20/17 0806      Exercises   Exercises  Knee/Hip      Knee/Hip Exercises: Aerobic   Nustep  lvl 4 x 6' - AAROM for knee flexion ROM       Knee/Hip Exercises: Seated   Long Arc Quad  Left;15 reps;Weights;Strengthening    Long Arc Quad Weight  2 lbs.    Long [employer] Quad Limitations  + hip adduction ball squeeze    Other Seated Knee/Hip Exercises  L Fitter leg press (2 blue) x15; AAROM into flexion on quad relaxation      Knee/Hip Exercises: Supine   Straight Leg Raises  Left;10 reps;Strengthening    Straight Leg Raises Limitations  2#    Straight Leg Raise with External Rotation  Left;10 reps;Strengthening    Straight Leg Raise with  External Rotation Limitations  2#      Knee/Hip Exercises: Sidelying   Hip ABduction  Left;10 reps;AROM    Hip ABduction Limitations  2#    Hip ADduction  Left;10 reps;Strengthening    Hip ADduction Limitations  2#      Knee/Hip Exercises: Prone   Hamstring Curl  10 reps;3 seconds    Hamstring Curl Limitations  2#    Hip Extension  Left;10 reps;Strengthening    Hip Extension Limitations  2#      Manual Therapy   Manual Therapy  Joint mobilization    Joint Mobilization  seated L knee distraction with grade II-III tibia on femur P/A mobs for increased flexion + inf patellar mobs               PT Short Term Goals - 04/13/17 8119      PT SHORT TERM GOAL #1   Title  Independent with initial HEP    Status  Achieved      PT SHORT TERM GOAL #2   Title  L knee AROM 0-80 dg     Status  On-going        PT Long Term Goals - 03/23/17 0818      PT LONG TERM GOAL #1   Title  Independent with ongoing HEP    Status  On-going      PT LONG TERM GOAL #2   Title  L knee AROM 0-120     Status   On-going      PT LONG TERM GOAL #3   Title  L hip and knee strength >/= 4/5    Status  On-going      PT LONG TERM GOAL #4   Title  Pt will ambulate with normal gait pattern w/o AD on level surfaces    Status  On-going      PT LONG TERM GOAL #5   Title  Pt will ascend/descend a flight of stairs with good reciprocal pattern to allow increased ease of access to 2nd floor apt    Status  On-going            Plan - 04/20/17 0806    Clinical Impression Statement  Pt tolerating progression of strengthening exercises with pt able to move from AAROM to light weights for 4 way SLR. Pt continues to move very slowly through exercises and ROM activities limiting number of activities able to be attempted during therapy sessions. Still awaiting f/u from JAS splinting rep for fitting for JAS brace.    Rehab Potential  Good    Clinical Impairments Affecting Rehab Potential  h/o L ACL tear & tibial plateau fracture s/p surgical repair & MUA; R tibial plateau fracture; 2010 - R THA d/t avascular necrosis    PT Treatment/Interventions  Patient/family education;Manual techniques;Passive range of motion;Scar mobilization;Dry needling;Taping;Therapeutic exercise;Neuromuscular re-education;Therapeutic activities;Functional mobility training;Gait training;Stair training;Moist Heat;Electrical Stimulation;Cryotherapy;Vasopneumatic Device;Ultrasound;Iontophoresis 4mg /ml Dexamethasone;ADLs/Self Care Home Management    PT Next Visit Plan  TDWB x 2 weeks (as of 04/14/17) then progress to WBAT in brace; weaning out of brace in two more weeks     Consulted and Agree with Plan of Care  Patient       Patient will benefit from skilled therapeutic intervention in order to improve the following deficits and impairments:  Pain, Decreased range of motion, Impaired flexibility, Increased muscle spasms, Increased fascial restricitons, Decreased strength, Abnormal gait, Difficulty walking, Decreased activity tolerance, Decreased  balance, Decreased scar mobility  Visit Diagnosis: Stiffness  of left knee, not elsewhere classified  Acute pain of left knee  Difficulty in walking, not elsewhere classified  Other abnormalities of gait and mobility     Problem List Patient Active Problem List   Diagnosis Date Noted  . Nicotine dependence 03/06/2017  . Bipolar II disorder (HCC)   . Closed bicondylar fracture of left tibial plateau 03/04/2017  . Avascular necrosis of bone of hip (HCC) 01/14/2017  . Chronic hip pain 01/14/2017  . ADD (attention deficit disorder) 11/10/2016  . 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 Personal history of hyperthyroidism 07/16/2011  . Knee pain 05/06/2011    Marry GuanJoAnne M Kreis, PT, MPT 04/20/2017, 1:10 PM  [facility] [place] [address]  [address] [place], [place], [address] Phone: [phone]   Fax:  [phone]  Name: Alice [name] MRN: [id number] Date of Birth: 1975-11-12

## 2017-04-22 ENCOUNTER — Ambulatory Visit: Payer: 59 | Admitting: Physical Therapy

## 2017-04-22 ENCOUNTER — Encounter: Payer: Self-pay | Admitting: Physical Therapy

## 2017-04-22 DIAGNOSIS — M25662 Stiffness of left knee, not elsewhere classified: Secondary | ICD-10-CM | POA: Diagnosis not present

## 2017-04-22 DIAGNOSIS — M25562 Pain in left knee: Secondary | ICD-10-CM

## 2017-04-22 DIAGNOSIS — R262 Difficulty in walking, not elsewhere classified: Secondary | ICD-10-CM

## 2017-04-22 DIAGNOSIS — R2689 Other abnormalities of gait and mobility: Secondary | ICD-10-CM

## 2017-04-22 NOTE — Therapy (Signed)
[facility] [address]  [address] [place], [place], [address] Phone: [phone]   Fax:  [phone]  Physical Therapy Treatment  Patient Details  Name: Alice [name] MRN: [id number] Date of Birth: 10/28/1975 Referring Provider: Myrene GalasMichael Handy, MD   Encounter Date: 04/22/2017  PT End of Session - 04/22/17 0807    Visit Number  12    Number of Visits  26    Date for PT Re-Evaluation  06/14/17    Authorization Type  [employer] (benefit period July - June)    Authorization - Number of Visits  27 VL 30 - 3 visits used with HH PT   VL 30 - [facility]   PT Start Time  0807 pt arrived late   pt arrived late   PT Stop Time  0851    PT Time Calculation (min)  44 min    Activity Tolerance  Patient tolerated treatment well    Behavior During Therapy  [facility] for tasks assessed/performed       Past Medical History:  Diagnosis Date  . ADHD (attention deficit hyperactivity disorder)   . Avascular necrosis of bone of hip (HCC)    RIGHT GRAFT  . Bipolar II disorder (HCC)   . Depression   . Nicotine dependence 03/06/2017  . PONV (postoperative nausea and vomiting)   . Wears glasses     Past Surgical History:  Procedure Laterality Date  . APPENDECTOMY  2000  . BREAST SURGERY  06/2009   RIGHT BREAST BIOPSY-NEGATIVE  . DILATION AND CURETTAGE OF UTERUS    . HIP ARTHROSCOPY  2008, 2010   AVASCULAR NECROSIS--RIGHT HIP 2010  . KNEE SURGERY  1995   ACL  . SHOULDER SURGERY  [phone],2003.2008  . TONSILLECTOMY    . WISDOM TOOTH EXTRACTION      There were no vitals filed for this visit.  Subjective Assessment - 04/22/17 0809    Subjective  Pt reporting increased stiffness for the past 2 days - thinks it is weather related.    Pertinent History  03/04/17 - L bicondylar tibial plateau fracture s/p ORIF, L lateral meniscus tear s/p repair & L anterior compartment fasciotomy; 1995 - L ACL tear & tibial plateau fracture s/p surgical  repair & MUA; R tibial plateau fracture; 2010 - R THA d/t avascular necrosis    Patient Stated Goals  "get it to bend"    Currently in Pain?  No/denies    Pain Score  0-No pain         [facility] PT Assessment - 04/22/17 0807      Assessment   Medical Diagnosis  L bicondylar tibial plateau fracture s/p ORIF, L lateral meniscus tear s/p repair & L anterior compartment fasciotomy    Referring Provider  Myrene GalasMichael Handy, MD    Onset Date/Surgical Date  03/04/17 date of injury - 02/22/17, surgery - 03/04/17   date of injury - 02/22/17, surgery - 03/04/17     Restrictions   LLE Weight Bearing  Touchdown weight bearing until 04/28/17, then WBAT in brace x 2 weeks   until 04/28/17, then WBAT in brace x 2 weeks     AROM   Left Knee Flexion  78      PROM   Left Knee Flexion  80                  [facility] Adult PT Treatment/Exercise - 04/22/17 [id number]  Exercises   Exercises  Knee/Hip      Knee/Hip Exercises: Aerobic   Nustep  lvl 4 x 6' - AAROM for knee flexion ROM       Knee/Hip Exercises: Seated   Heel Slides  Left;10 reps 5" hold   5" hold   Heel Slides Limitations  contract/relax with strap looped around legs of chair    Hamstring Curl  Left;15 reps;Strengthening    Hamstring Limitations  yellow TB      Knee/Hip Exercises: Supine   Heel Slides  Left;AAROM;10 reps    Heel Slides Limitations  contract/relax with manual overpressure by PT      Manual Therapy   Manual Therapy  Joint mobilization;Soft tissue mobilization;Passive ROM    Joint Mobilization  seated L knee distraction with grade II-III tibia on femur P/A mobs for increased flexion + inf patellar mobs    Soft tissue mobilization  STM & strumming to L quads & ITB    Passive ROM  L knee flexion with contract/relax at end ROM       Trigger Point Dry Needling - 04/22/17 0807    Consent Given?  Yes    Muscles Treated Lower Body  Quadriceps    Quadriceps Response  Twitch response elicited;Palpable increased muscle  length Lt VI/RF & VL   Lt VI/RF & VL            PT Short Term Goals - 04/22/17 0810      PT SHORT TERM GOAL #1   Title  Independent with initial HEP    Status  Achieved      PT SHORT TERM GOAL #2   Title  L knee AROM 0-80 dg     Status  On-going        PT Long Term Goals - 03/23/17 0818      PT LONG TERM GOAL #1   Title  Independent with ongoing HEP    Status  On-going      PT LONG TERM GOAL #2   Title  L knee AROM 0-120     Status  On-going      PT LONG TERM GOAL #3   Title  L hip and knee strength >/= 4/5    Status  On-going      PT LONG TERM GOAL #4   Title  Pt will ambulate with normal gait pattern w/o AD on level surfaces    Status  On-going      PT LONG TERM GOAL #5   Title  Pt will ascend/descend a flight of stairs with good reciprocal pattern to allow increased ease of access to 2nd floor apt    Status  On-going            Plan - 04/22/17 0811    Clinical Impression Statement  Pt feeling like L flexion ROM is plateauing with onlt slight improvement in ROM since last week. Increased edema noted in knee and lower leg today with pt reporting she was standing more than usual yesterday. Continued taut bands present in mid and lateral L quads, therefore performed DN again today with postive twitch response elicited followed by STM and various stretching and ROM activities. Recommended pt obtain foam roller for home use to continue to work on reducing quad tension/tightness to improve flexibility and flexion ROM.    Rehab Potential  Good    Clinical Impairments Affecting Rehab Potential  h/o L ACL tear & tibial plateau fracture s/p surgical repair & MUA;  R tibial plateau fracture; 2010 - R THA d/t avascular necrosis    PT Treatment/Interventions  Patient/family education;Manual techniques;Passive range of motion;Scar mobilization;Dry needling;Taping;Therapeutic exercise;Neuromuscular re-education;Therapeutic activities;Functional mobility training;Gait  training;Stair training;Moist Heat;Electrical Stimulation;Cryotherapy;Vasopneumatic Device;Ultrasound;Iontophoresis 4mg /ml Dexamethasone;ADLs/Self Care Home Management    PT Next Visit Plan  TDWB x 2 weeks (as of 04/14/17) then progress to WBAT in brace; weaning out of brace in two more weeks; F/u re: JAS flexion splint    Consulted and Agree with Plan of Care  Patient       Patient will benefit from skilled therapeutic intervention in order to improve the following deficits and impairments:  Pain, Decreased range of motion, Impaired flexibility, Increased muscle spasms, Increased fascial restricitons, Decreased strength, Abnormal gait, Difficulty walking, Decreased activity tolerance, Decreased balance, Decreased scar mobility  Visit Diagnosis: Stiffness of left knee, not elsewhere classified  Acute pain of left knee  Difficulty in walking, not elsewhere classified  Other abnormalities of gait and mobility     Problem List Patient Active Problem List   Diagnosis Date Noted  . Nicotine dependence 03/06/2017  . Bipolar II disorder (HCC)   . Closed bicondylar fracture of left tibial plateau 03/04/2017  . Avascular necrosis of bone of hip (HCC) 01/14/2017  . Chronic hip pain 01/14/2017  . ADD (attention deficit disorder) 11/10/2016  . 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 Personal history of hyperthyroidism 07/16/2011  . Knee pain 05/06/2011    Marry GuanJoAnne M Shantia Sanford, PT, MPT 04/22/2017, 10:16 AM  [facility] [address]  [address] [place], [place], [address] Phone: [phone]   Fax:  [phone]  Name: Alice [name] MRN: [id number] Date of Birth: 10/06/1975

## 2017-04-26 ENCOUNTER — Ambulatory Visit: Payer: 59

## 2017-04-29 ENCOUNTER — Ambulatory Visit: Payer: 59 | Admitting: Physical Therapy

## 2017-04-29 ENCOUNTER — Encounter: Payer: Self-pay | Admitting: Physical Therapy

## 2017-04-29 DIAGNOSIS — M25662 Stiffness of left knee, not elsewhere classified: Secondary | ICD-10-CM | POA: Diagnosis not present

## 2017-04-29 DIAGNOSIS — M25562 Pain in left knee: Secondary | ICD-10-CM

## 2017-04-29 DIAGNOSIS — R262 Difficulty in walking, not elsewhere classified: Secondary | ICD-10-CM

## 2017-04-29 DIAGNOSIS — R2689 Other abnormalities of gait and mobility: Secondary | ICD-10-CM

## 2017-04-29 NOTE — Therapy (Addendum)
[facility] [address]  [address] [place], [place], [address] Phone: [phone]   Fax:  [phone]  Physical Therapy Treatment  Patient Details  Name: Alice [name] MRN: [id number] Date of Birth: [birth year] Referring Provider: Myrene Galas, MD   Encounter Date: 04/29/2017  PT End of Session - 04/29/17 0801    Visit Number  13    Number of Visits  26    Date for PT Re-Evaluation  06/14/17    Authorization Type  [employer] (benefit period July - June)    Authorization - Number of Visits  27 VL 30 - 3 visits used with [facility] PT    PT Start Time  0801    PT Stop Time  0847    PT Time Calculation (min)  46 min    Activity Tolerance  Patient tolerated treatment well    Behavior During Therapy  [facility] for tasks assessed/performed       Past Medical History:  Diagnosis Date  . ADHD (attention deficit hyperactivity disorder)   . Avascular necrosis of bone of hip (HCC)    RIGHT GRAFT  . Bipolar II disorder (HCC)   . Depression   . Nicotine dependence 03/06/2017  . PONV (postoperative nausea and vomiting)   . Wears glasses     Past Surgical History:  Procedure Laterality Date  . APPENDECTOMY  2000  . BREAST SURGERY  06/2009   RIGHT BREAST BIOPSY-NEGATIVE  . DILATION AND CURETTAGE OF UTERUS    . FASCIOTOMY Left 03/04/2017   Procedure: ANTERIOR COMPARTMENT FASCIOTOMY;  Surgeon: Myrene Galas, MD;  Location: [facility] OR;  Service: Orthopedics;  Laterality: Left;  . HIP ARTHROSCOPY  2008, 2010   AVASCULAR NECROSIS--RIGHT HIP 2010  . KNEE SURGERY  1995   ACL  . ORIF TIBIA PLATEAU Left 03/04/2017   Procedure: OPEN REDUCTION INTERNAL FIXATION (ORIF) TIBIAL PLATEAU;  Surgeon: Myrene Galas, MD;  Location: [facility] OR;  Service: Orthopedics;  Laterality: Left;  . SHOULDER SURGERY  [phone]  . TONSILLECTOMY    . WISDOM TOOTH EXTRACTION      There were no vitals filed for this visit.  Subjective Assessment - 04/29/17 0804     Subjective  Pt noting mild soreness which she thinks stems from how she stretcches in her sleep.    Pertinent History  03/04/17 - L bicondylar tibial plateau fracture s/p ORIF, L lateral meniscus tear s/p repair & L anterior compartment fasciotomy; 1995 - L ACL tear & tibial plateau fracture s/p surgical repair & MUA; R tibial plateau fracture; 2010 - R THA d/t avascular necrosis    Patient Stated Goals  "get it to bend"    Currently in Pain?  Yes    Pain Score  1     Pain Location  Tibia    Pain Orientation  Left;Upper    Pain Descriptors / Indicators  Sore    Pain Type  Acute pain;Surgical pain    Pain Frequency  Intermittent         [facility] PT Assessment - 04/29/17 0801      Restrictions   LLE Weight Bearing  Weight bearing as tolerated in brace      AROM   Left Knee Extension  0    Left Knee Flexion  85                  [facility] Adult PT Treatment/Exercise - 04/29/17 0801      Ambulation/Gait   Ambulation/Gait  Assistance  5: Supervision    Ambulation/Gait Assistance Details  Cues for gait sequencing with crutches progressing to WBAT on L (pt instucted to wear brace when WBAT outside of clinic). Cues for heel stike, heel-toe progression and approriate hip and knee flexion during toe-off and swing throug phase of gair.    Assistive device  Crutches    Gait Pattern  Step-through pattern;Decreased weight shift to left    Ambulation Surface  Level;Indoor      Exercises   Exercises  Knee/Hip      Knee/Hip Exercises: Stretches   Knee: Self-Stretch to increase Flexion  Left 10x5"    Gastroc Stretch  Left;30 seconds;2 reps    Gastroc Stretch Limitations  standing at wall    Soleus Stretch  Left;30 seconds;2 reps    Soleus Stretch Limitations  standing at wall      Knee/Hip Exercises: Aerobic   Nustep  lvl 4 x 6' - AAROM for knee flexion ROM       Knee/Hip Exercises: Seated   Heel Slides  Left;10 reps 5" hold    Heel Slides Limitations  contract/relax with strap looped  around legs of chair    Hamstring Curl  Left;15 reps;Strengthening    Hamstring Limitations  red TB             PT Education - 04/29/17 0847    Education provided  Yes    Education Details  HEP update + review of contract/relax knee flexion stretch with strap in sitting    Person(s) Educated  Patient    Methods  Explanation;Demonstration;Handout    Comprehension  Verbalized understanding;Returned demonstration       PT Short Term Goals - 04/22/17 0810      PT SHORT TERM GOAL #1   Title  Independent with initial HEP    Status  Achieved      PT SHORT TERM GOAL #2   Title  L knee AROM 0-80 dg     Status  On-going        PT Long Term Goals - 03/23/17 0818      PT LONG TERM GOAL #1   Title  Independent with ongoing HEP    Status  On-going      PT LONG TERM GOAL #2   Title  L knee AROM 0-120     Status  On-going      PT LONG TERM GOAL #3   Title  L hip and knee strength >/= 4/5    Status  On-going      PT LONG TERM GOAL #4   Title  Pt will ambulate with normal gait pattern w/o AD on level surfaces    Status  On-going      PT LONG TERM GOAL #5   Title  Pt will ascend/descend a flight of stairs with good reciprocal pattern to allow increased ease of access to 2nd floor apt    Status  On-going            Plan - 04/29/17 0806    Clinical Impression Statement  Pt able to progress to WBAT on L today but did not bring brace with her to PT. Provided training in gait WBAT with B axillary crutches, with cues for proper weight acceptance and progression through phases of gait - pt cautioned to only attempt WBAT outside of PT while in brace. Progressed stretching to include standing stretches for heelcord and knee flexion stretches. L knee ROM improved to 0-85 dg  today.    Rehab Potential  Good    Clinical Impairments Affecting Rehab Potential  h/o L ACL tear & tibial plateau fracture s/p surgical repair & MUA; R tibial plateau fracture; 2010 - R THA d/t avascular  necrosis    PT Treatment/Interventions  Patient/family education;Manual techniques;Passive range of motion;Scar mobilization;Dry needling;Taping;Therapeutic exercise;Neuromuscular re-education;Therapeutic activities;Functional mobility training;Gait training;Stair training;Moist Heat;Electrical Stimulation;Cryotherapy;Vasopneumatic Device;Ultrasound;Iontophoresis 4mg /ml Dexamethasone;ADLs/Self Care Home Management    PT Next Visit Plan  progress to WBAT in brace; weaning out of brace in two more weeks (~05/12/17); F/u re: JAS flexion splint    Consulted and Agree with Plan of Care  Patient       Patient will benefit from skilled therapeutic intervention in order to improve the following deficits and impairments:  Pain, Decreased range of motion, Impaired flexibility, Increased muscle spasms, Increased fascial restricitons, Decreased strength, Abnormal gait, Difficulty walking, Decreased activity tolerance, Decreased balance, Decreased scar mobility  Visit Diagnosis: Stiffness of left knee, not elsewhere classified  Acute pain of left knee  Difficulty in walking, not elsewhere classified  Other abnormalities of gait and mobility     Problem List Patient Active Problem List   Diagnosis Date Noted  . Nicotine dependence 03/06/2017  . Bipolar II disorder (HCC)   . Closed bicondylar fracture of left tibial plateau 03/04/2017  . Avascular necrosis of bone of hip (HCC) 01/14/2017  . Chronic hip pain 01/14/2017  . ADD (attention deficit disorder) 11/10/2016  . 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 Personal history of hyperthyroidism 07/16/2011  . Knee pain 05/06/2011    Marry GuanJoAnne M Niti Leisure, PT, MPT 04/29/2017, 10:59 AM  [facility] [address]  [address] [place], [place], [address] Phone: [phone]   Fax:  [phone]  Name: Gilda CreaseMargaret M Delaine MRN: [id number] Date of Birth: [birth year]

## 2017-05-03 ENCOUNTER — Ambulatory Visit (INDEPENDENT_AMBULATORY_CARE_PROVIDER_SITE_OTHER): Payer: 59 | Admitting: Psychiatry

## 2017-05-03 ENCOUNTER — Encounter (HOSPITAL_COMMUNITY): Payer: Self-pay | Admitting: Psychiatry

## 2017-05-03 ENCOUNTER — Ambulatory Visit: Payer: 59 | Admitting: Physical Therapy

## 2017-05-03 VITALS — BP 118/66 | HR 88 | Ht 63.25 in | Wt 130.0 lb

## 2017-05-03 DIAGNOSIS — Z79899 Other long term (current) drug therapy: Secondary | ICD-10-CM | POA: Diagnosis not present

## 2017-05-03 DIAGNOSIS — F988 Other specified behavioral and emotional disorders with onset usually occurring in childhood and adolescence: Secondary | ICD-10-CM

## 2017-05-03 DIAGNOSIS — G4710 Hypersomnia, unspecified: Secondary | ICD-10-CM | POA: Diagnosis not present

## 2017-05-03 DIAGNOSIS — F3181 Bipolar II disorder: Secondary | ICD-10-CM | POA: Diagnosis not present

## 2017-05-03 DIAGNOSIS — F4312 Post-traumatic stress disorder, chronic: Secondary | ICD-10-CM

## 2017-05-03 DIAGNOSIS — F17210 Nicotine dependence, cigarettes, uncomplicated: Secondary | ICD-10-CM

## 2017-05-03 MED ORDER — METHYLPHENIDATE HCL 10 MG PO TABS: 10.0000 mg | ORAL_TABLET | Freq: Two times a day (BID) | ORAL | 0 refills | Status: DC

## 2017-05-03 MED ORDER — LAMOTRIGINE 200 MG PO TABS: 200.0000 mg | ORAL_TABLET | Freq: Every day | ORAL | 1 refills | Status: DC

## 2017-05-03 MED ORDER — SILDENAFIL CITRATE 50 MG PO TABS: 50.0000 mg | ORAL_TABLET | Freq: Every day | ORAL | 0 refills | Status: DC | PRN

## 2017-05-03 MED ORDER — DEXMETHYLPHENIDATE HCL ER 40 MG PO CP24: 40.0000 mg | ORAL_CAPSULE | Freq: Every day | ORAL | 0 refills | Status: DC

## 2017-05-03 MED ORDER — MODAFINIL 200 MG PO TABS: 200.0000 mg | ORAL_TABLET | Freq: Every day | ORAL | 2 refills | Status: DC

## 2017-05-03 MED ORDER — ARIPIPRAZOLE 10 MG PO TABS: 10.0000 mg | ORAL_TABLET | Freq: Every day | ORAL | 1 refills | Status: DC

## 2017-05-03 NOTE — Patient Instructions (Signed)
[facility] [phone] 

## 2017-05-03 NOTE — Progress Notes (Signed)
BH MD/PA/NP OP Progress Note  05/03/2017 4:08 PM Alice [name]  MRN:  [id number]  Chief Complaint: Energy still a problem HPI: Patient reports that her mood has remained stable, no episodes of mania or psychosis.  She continues to develop her relationship with her boyfriend, and things are going well in her romance and in her job.  She does struggle with some sexual dysfunction and anorgasmia.  I educated her on the utility of Viagra for females with sexual dysfunction, and she was agreeable to starting a low dose as needed.  Educated her on the risk of hypotension and the modality of action.   Regarding her energy and concentration, we agreed to increase Focalin to 40 mg XR, and agreed to increase her Ritalin IR to 10 mg twice a day.  She feels like Provigil increase has helped a little bit, but is ultimately on board with getting a repeat sleep study and multiple sleep latency tests to assess whether she would be appropriate candidate for Xyrem.  Visit Diagnosis:    ICD-10-CM   1. Chronic post-traumatic stress disorder (PTSD) F43.12   2. Attention deficit disorder, unspecified hyperactivity presence F98.8 Dexmethylphenidate HCl (FOCALIN XR) 40 MG CP24    Dexmethylphenidate HCl (FOCALIN XR) 40 MG CP24    Dexmethylphenidate HCl (FOCALIN XR) 40 MG CP24    modafinil (PROVIGIL) 200 MG tablet  3. Bipolar 2 disorder (HCC) F31.81 ARIPiprazole (ABILIFY) 10 MG tablet  4. Bipolar II disorder (HCC) F31.81 lamoTRIgine (LAMICTAL) 200 MG tablet  5. Hypersomnia G47.10 Polysomnography 4 or more parameters    Multiple sleep latency test    Past Psychiatric History: See intake H&P for full details. Reviewed, with no updates at this time.   Past Medical History:  Past Medical History:  Diagnosis Date  . ADHD (attention deficit hyperactivity disorder)   . Avascular necrosis of bone of hip (HCC)    RIGHT GRAFT  . Bipolar II disorder (HCC)   . Depression   . Nicotine dependence 03/06/2017  . PONV  (postoperative nausea and vomiting)   . Wears glasses     Past Surgical History:  Procedure Laterality Date  . ANTERIOR COMPARTMENT FASCIOTOMY Left 03/04/2017   Performed [facility]asHandy, Michael[facility]  . APPENDECTOMY  2000  . BREAST SURGERY  06/2009   RIGHT BREAST BIOPSY-NEGATIVE  . DILATION AND CURETTAGE OF UTERUS    . HIP ARTHROSCOPY  2008, 2010   AVASCULAR NECROSIS--RIGHT HIP 2010  . KNEE SURGERY  1995   ACL  . OPEN REDUCTION INTERNAL FIXATION (ORIF) TIBIAL PLATEAU Left 03/04/2017   Performed [facility]asHandy, [facility]  . SHOULDER SURGERY  [phone]  . TONSILLECTOMY    . WISDOM TOOTH EXTRACTION      Family Psychiatric History: See intake H&P for full details. Reviewed, with no updates at this time.   Family History:  Family History  Problem Relation Age of Onset  . Breast cancer Mother [id number]       BRACA NEGATIVE  . Hypertension Father   . Heart disease Father   . Cancer Father        PROSTATE    Social History:  Social History   Socioeconomic History  . Marital status: Single    Spouse name: None  . Number of children: None  . Years of education: None  . Highest education level: None  Social Needs  . Financial resource strain: None  . Food insecurity - worry: None  . Food  insecurity - inability: None  . Transportation needs - medical: None  . Transportation needs - non-medical: None  Occupational History  . None  Tobacco Use  . Smoking status: Current Some Day Smoker    Types: Cigarettes  . Smokeless tobacco: Never Used  Substance and Sexual Activity  . Alcohol use: Yes    Comment: social  . Drug use: No  . Sexual activity: Yes    Partners: Male    Birth control/protection: Pill  Other Topics Concern  . None  Social History Narrative  . None    Allergies:  Allergies  Allergen Reactions  . Methotrexate Derivatives     UNSPECIFIED REACTION   . Chantix [Varenicline Tartrate] Rash    Metabolic Disorder Labs: Lab Results  Component  Value Date   HGBA1C 5.0 03/04/2017   MPG 96.8 03/04/2017   MPG 108 11/26/2010   No results found for: PROLACTIN Lab Results  Component Value Date   CHOL 166 11/26/2010   TRIG 89 11/26/2010   HDL 50 11/26/2010   CHOLHDL 3.3 11/26/2010   VLDL 18 11/26/2010   LDLCALC 98 11/26/2010   Lab Results  Component Value Date   TSH 2.039 03/04/2017   TSH 1.135 11/26/2010    Therapeutic Level Labs: No results found for: LITHIUM No results found for: VALPROATE No components found for:  CBMZ  Current Medications: Current Outpatient Medications  Medication Sig Dispense Refill  . ARIPiprazole (ABILIFY) 10 MG tablet Take 1 tablet (10 mg total) daily by mouth. 90 tablet 1  . lamoTRIgine (LAMICTAL) 200 MG tablet Take 1 tablet (200 mg total) daily by mouth. 90 tablet 1  . modafinil (PROVIGIL) 200 MG tablet Take 1 tablet (200 mg total) daily by mouth. 30 tablet 2  . Dexmethylphenidate HCl (FOCALIN XR) 40 MG CP24 Take 1 capsule (40 mg total) daily by mouth. 30 capsule 0  . Dexmethylphenidate HCl (FOCALIN XR) 40 MG CP24 Take 1 capsule (40 mg total) daily by mouth. 30 capsule 0  . Dexmethylphenidate HCl (FOCALIN XR) 40 MG CP24 Take 1 capsule (40 mg total) daily by mouth. 30 capsule 0  . methylphenidate (RITALIN) 10 MG tablet Take 1 tablet (10 mg total) 2 (two) times daily with breakfast and lunch by mouth. 60 tablet 0  . methylphenidate (RITALIN) 10 MG tablet Take 1 tablet (10 mg total) 2 (two) times daily with breakfast and lunch by mouth. 60 tablet 0  . methylphenidate (RITALIN) 10 MG tablet Take 1 tablet (10 mg total) 2 (two) times daily with breakfast and lunch by mouth. 60 tablet 0  . sildenafil (VIAGRA) 50 MG tablet Take 1 tablet (50 mg total) daily as needed by mouth (take 1 hour before sexual activity). 30 tablet 0   No current facility-administered medications for this visit.      Musculoskeletal: Strength & Muscle Tone: within normal limits Gait & Station: normal Patient leans: N/A   Using crutches, broke left foot recently  Psychiatric Specialty Exam: ROS  Blood pressure 118/66, pulse 88, height 5' 3.25" (1.607 m), weight 130 lb (59 kg).Body mass index is 22.85 kg/m.  General Appearance: Casual and Fairly Groomed  Eye Contact:  Good  Speech:  Clear and Coherent  Volume:  Normal  Mood:  Euthymic  Affect:  Appropriate and Congruent  Thought Process:  Goal Directed and Descriptions of Associations: Intact  Orientation:  Full (Time, Place, and Person)  Thought Content: Logical   Suicidal Thoughts:  No  Homicidal Thoughts:  No  Memory:  Immediate;   Good  Judgement:  Good  Insight:  Fair  Psychomotor Activity:  Normal  Concentration:  Concentration: Fair  Recall:  Fair  Fund of Knowledge: Good  Language: Good  Akathisia:  Negative  Handed:  Right  AIMS (if indicated): done  Assets:  Communication Skills Desire for Improvement Financial Resources/Insurance Housing Intimacy Leisure Time Resilience Transportation Vocational/Educational  ADL's:  Intact  Cognition: WNL  Sleep:  Good   Screenings: GAD-7     [facility]  Total GAD-7 Score  6    PHQ2-9     [facility]  PHQ-2 Total Score  0  0       Assessment and Plan:  Alice CreaseMargaret M [name] presents with stable mood in terms of bipolar illness, continues to contend with symptoms of hypersomnia.  We agreed to titrate stimulant as below and repeat sleep study.  She continues to struggle with some sexual dysfunction from Abilify, and is agreeable to trial of Viagra.  No acute safety issues, we will follow-up in 10-12 weeks.  1. Chronic post-traumatic stress disorder (PTSD)   2. Attention deficit disorder, unspecified hyperactivity presence   3. Bipolar 2 disorder (HCC)   4. Bipolar II disorder (HCC)   5. Hypersomnia     Status  of current problems: gradually improving  Labs Ordered: Orders Placed This Encounter  Procedures  . Polysomnography 4 or more parameters    Standing Status:   Future    Standing Expiration Date:   05/03/2018    Order Specific Question:   Where should this test be performed:    Answer[facility]   [facility]  . Multiple sleep latency test    Standing Status:   Future    Standing Expiration Date:   05/03/2018    Order Specific Question:   Where should this test be performed:    Answer[facility]   [facility]    Labs Reviewed: n/a  Collateral Obtained/Records Reviewed: n/a  Plan:  Continue Abilify 10 mg daily Continue Lamictal 200 mg daily Continue Focalin XR increased to 40 mg daily Increase Ritalin to 10 mg twice daily breakfast and lunch  continue Provigil 200 mg daily Repeat sleep study and multiple sleep latency test Viagra 25-50 mg for sexual dysfunction  I spent 20 minutes with the patient in direct face-to-face clinical care.  Greater than 50% of this time was spent in counseling and coordination of care with the patient.    Burnard LeighAlexander Arya Leahanna Buser, MD 05/03/2017, 4:07 PM

## 2017-05-04 ENCOUNTER — Ambulatory Visit: Payer: 59

## 2017-05-04 DIAGNOSIS — M25662 Stiffness of left knee, not elsewhere classified: Secondary | ICD-10-CM

## 2017-05-04 DIAGNOSIS — R262 Difficulty in walking, not elsewhere classified: Secondary | ICD-10-CM

## 2017-05-04 DIAGNOSIS — M25562 Pain in left knee: Secondary | ICD-10-CM

## 2017-05-04 DIAGNOSIS — R2689 Other abnormalities of gait and mobility: Secondary | ICD-10-CM

## 2017-05-04 NOTE — Therapy (Addendum)
[facility] [place] [address] [place] [address]  [address] [place], [place], [address] Phone: [phone]   Fax:  [phone]  Physical Therapy Treatment  Patient Details  Name: Alice [name] MRN: [id number] Date of Birth: 11/22/1975 Referring Provider: Myrene GalasMichael Handy, MD   Encounter Date: 05/04/2017  PT End of Session - 05/04/17 0812    Visit Number  14    Number of Visits  26    Date for PT Re-Evaluation  06/14/17    Authorization Type  [employer] (benefit period July - June)    Authorization - Number of Visits  27 VL 30 - [facility]    PT Start Time  0807 pt. arrived late     PT Stop Time  0846    PT Time Calculation (min)  39 min    Activity Tolerance  Patient tolerated treatment well    Behavior During Therapy  [facility] for tasks assessed/performed       Past Medical History:  Diagnosis Date  . ADHD (attention deficit hyperactivity disorder)   . Avascular necrosis of bone of hip (HCC)    RIGHT GRAFT  . Bipolar II disorder (HCC)   . Depression   . Nicotine dependence 03/06/2017  . PONV (postoperative nausea and vomiting)   . Wears glasses     Past Surgical History:  Procedure Laterality Date  . ANTERIOR COMPARTMENT FASCIOTOMY Left 03/04/2017   [facility]Handy[facility]  . APPENDECTOMY  2000  . BREAST SURGERY  06/2009   RIGHT BREAST BIOPSY-NEGATIVE  . DILATION AND CURETTAGE OF UTERUS    . HIP ARTHROSCOPY  2008, 2010   AVASCULAR NECROSIS--RIGHT HIP 2010  . KNEE SURGERY  1995   ACL  . OPEN REDUCTION INTERNAL FIXATION (ORIF) TIBIAL PLATEAU Left 03/04/2017   [facility]  . SHOULDER SURGERY  [phone],2003.2008  . TONSILLECTOMY    . WISDOM TOOTH EXTRACTION      There were no vitals filed for this visit.  Subjective Assessment - 05/04/17 0809    Subjective  Pt. reporting she has been feeling comfortable with WBAT with crutches and has been wearing brace.      Pertinent History  03/04/17  - L bicondylar tibial plateau fracture s/p ORIF, L lateral meniscus tear s/p repair & L anterior compartment fasciotomy; 1995 - L ACL tear & tibial plateau fracture s/p surgical repair & MUA; R tibial plateau fracture; 2010 - R THA d/t avascular necrosis    Patient Stated Goals  "get it to bend"    Currently in Pain?  No/denies    Pain Score  0-No pain    Multiple Pain Sites  No              Therex:  Seated machine Leg Press 15# x 15 reps; 3" hold for knee flexion stretch       [facility] Adult PT Treatment/Exercise - 05/04/17 0817      Ambulation/Gait   Ambulation/Gait  Yes    Ambulation/Gait Assistance  5: Supervision    Ambulation/Gait Assistance Details  Good heel-toe progression and sequencing with B crutches however did require cueing for increased knee flexion with swing phase    Ambulation Distance (Feet)  90 Feet    Assistive device  Crutches    Gait Pattern  Step-through pattern;Decreased weight shift to left    Ambulation Surface  Level;Indoor      Knee/Hip Exercises: Stretches  Research officer, trade union reps instruction on contract/relax stretching for home    Quad Stretch Limitations  prone - bolster under thigh       Knee/Hip Exercises: Aerobic   Nustep  lvl 4 x 6' - AAROM for knee flexion ROM  seated at lvl 6      Knee/Hip Exercises: Standing   Knee Flexion  Left;10 reps    Knee Flexion Limitations  3#; at counter       Knee/Hip Exercises: Seated   Heel Slides  Left;10 reps    Heel Slides Limitations  contract/relax + LAQ 2#, 5" hold     Other Seated Knee/Hip Exercises  L Fitter leg press (1 blue, 1 black band) x 10; AAROM into flexion on quad relaxation      Knee/Hip Exercises: Prone   Hip Extension  Left;Strengthening;15 reps    Hip Extension Limitations  2#      Manual Therapy   Manual Therapy  Joint mobilization;Soft tissue mobilization;Passive ROM    Manual therapy comments  seated and supine     Joint Mobilization  seated L knee distraction with  grade II-III tibia on femur P/A mobs for increased flexion + inf patellar mobs; supine anterior/posterior mobs grade III in varying degress of flexion     Passive ROM  manual L knee flexion with contract/relax at end ROM; pushed flexion to moderate discomfort                PT Short Term Goals - 04/22/17 0810      PT SHORT TERM GOAL #1   Title  Independent with initial HEP    Status  Achieved      PT SHORT TERM GOAL #2   Title  L knee AROM 0-80 dg     Status  On-going        PT Long Term Goals - 03/23/17 0818      PT LONG TERM GOAL #1   Title  Independent with ongoing HEP    Status  On-going      PT LONG TERM GOAL #2   Title  L knee AROM 0-120     Status  On-going      PT LONG TERM GOAL #3   Title  L hip and knee strength >/= 4/5    Status  On-going      PT LONG TERM GOAL #4   Title  Pt will ambulate with normal gait pattern w/o AD on level surfaces    Status  On-going      PT LONG TERM GOAL #5   Title  Pt will ascend/descend a flight of stairs with good reciprocal pattern to allow increased ease of access to 2nd floor apt    Status  On-going            Plan - 05/04/17 0841    Clinical Impression Statement  Alice [name] arriving late to therapy reporting difficulty with coworker providing ride.  Has been performing updated HEP and does not feel she needs to review.  Alice [name] reports she is feeling more comfortable with WBAT with crutches and able to demo good sequencing, and heel-toe progression in treatment today.  Still requiring some cueing for increased hip/knee flexion with swing phase.  Performed PNF flexion stretching, A/P joint mobs in various positions, and instructed pt. in additional strategies to perform contract/relax stretching at home for hopeful continued improvement in flexion ROM.  Added leg-press and increased resistance with seated fitter for overpressure stretch into  flexion with pt. tolerating well.  Pt. encouraged to start back performing  self-patellar, inferior glides at home with HEP activities as she admits she has not been performing this at home.  Ended treatment pain free with pt. verbalizing knee feeling "looser".  Still awaiting f/u from JAS splinting rep for fitting for JAS brace.      Clinical Impairments Affecting Rehab Potential  h/o L ACL tear & tibial plateau fracture s/p surgical repair & MUA; R tibial plateau fracture; 2010 - R THA d/t avascular necrosis    PT Treatment/Interventions  Patient/family education;Manual techniques;Passive range of motion;Scar mobilization;Dry needling;Taping;Therapeutic exercise;Neuromuscular re-education;Therapeutic activities;Functional mobility training;Gait training;Stair training;Moist Heat;Electrical Stimulation;Cryotherapy;Vasopneumatic Device;Ultrasound;Iontophoresis 4mg /ml Dexamethasone;ADLs/Self Care Home Management    PT Next Visit Plan  progress to WBAT in brace; weaning out of brace in two more weeks (~05/12/17); F/u re: JAS flexion splint       Patient will benefit from skilled therapeutic intervention in order to improve the following deficits and impairments:  Pain, Decreased range of motion, Impaired flexibility, Increased muscle spasms, Increased fascial restricitons, Decreased strength, Abnormal gait, Difficulty walking, Decreased activity tolerance, Decreased balance, Decreased scar mobility  Visit Diagnosis: Stiffness of left knee, not elsewhere classified  Acute pain of left knee  Difficulty in walking, not elsewhere classified  Other abnormalities of gait and mobility     Problem List Patient Active Problem List   Diagnosis Date Noted  . Nicotine dependence 03/06/2017  . Bipolar II disorder (HCC)   . Closed bicondylar fracture of left tibial plateau 03/04/2017  . Avascular necrosis of bone of hip (HCC) 01/14/2017  . Chronic hip pain 01/14/2017  . ADD (attention deficit disorder) 11/10/2016  . 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 Personal history  of hyperthyroidism 07/16/2011  . Knee pain 05/06/2011    Kermit BaloMicah Kyona Chauncey, PTA 05/04/17 9:21 AM  [facility] [place] [address]  [address] [place], [place], [address] Phone: [phone]   Fax:  [phone]  Name: Alice [name] MRN: [id number] Date of Birth: 02/06/1976

## 2017-05-10 ENCOUNTER — Ambulatory Visit: Payer: Self-pay | Admitting: Licensed Clinical Social Worker

## 2017-05-11 ENCOUNTER — Ambulatory Visit: Payer: 59 | Admitting: Physical Therapy

## 2017-05-11 ENCOUNTER — Encounter: Payer: Self-pay | Admitting: Physical Therapy

## 2017-05-11 DIAGNOSIS — M25662 Stiffness of left knee, not elsewhere classified: Secondary | ICD-10-CM | POA: Diagnosis not present

## 2017-05-11 DIAGNOSIS — R2689 Other abnormalities of gait and mobility: Secondary | ICD-10-CM

## 2017-05-11 DIAGNOSIS — R262 Difficulty in walking, not elsewhere classified: Secondary | ICD-10-CM

## 2017-05-11 DIAGNOSIS — M25562 Pain in left knee: Secondary | ICD-10-CM

## 2017-05-11 NOTE — Therapy (Signed)
[facility] [address]  [address] [place], [place], [address] Phone: [phone]   Fax:  [phone]  Physical Therapy Treatment  Patient Details  Name: Alice [name] MRN: [id number] Date of Birth: September 20, 1975 Referring Provider: Myrene Galas, MD   Encounter Date: 05/11/2017  PT End of Session - 05/11/17 0757    Visit Number  15    Number of Visits  26    Date for PT Re-Evaluation  06/14/17    Authorization Type  [employer] (benefit period July - June)    Authorization - Number of Visits  27 VL 30 - 3 visits used with [facility] PT    PT Start Time  0757    PT Stop Time  0849    PT Time Calculation (min)  52 min    Activity Tolerance  Patient tolerated treatment well    Behavior During Therapy  [facility] for tasks assessed/performed       Past Medical History:  Diagnosis Date  . ADHD (attention deficit hyperactivity disorder)   . Avascular necrosis of bone of hip (HCC)    RIGHT GRAFT  . Bipolar II disorder (HCC)   . Depression   . Nicotine dependence 03/06/2017  . PONV (postoperative nausea and vomiting)   . Wears glasses     Past Surgical History:  Procedure Laterality Date  . APPENDECTOMY  2000  . BREAST SURGERY  06/2009   RIGHT BREAST BIOPSY-NEGATIVE  . DILATION AND CURETTAGE OF UTERUS    . FASCIOTOMY Left 03/04/2017   Procedure: ANTERIOR COMPARTMENT FASCIOTOMY;  Surgeon: Myrene Galas, MD;  Location: [facility] OR;  Service: Orthopedics;  Laterality: Left;  . HIP ARTHROSCOPY  2008, 2010   AVASCULAR NECROSIS--RIGHT HIP 2010  . KNEE SURGERY  1995   ACL  . ORIF TIBIA PLATEAU Left 03/04/2017   Procedure: OPEN REDUCTION INTERNAL FIXATION (ORIF) TIBIAL PLATEAU;  Surgeon: Myrene Galas, MD;  Location: [facility] OR;  Service: Orthopedics;  Laterality: Left;  . SHOULDER SURGERY  [phone]  . TONSILLECTOMY    . WISDOM TOOTH EXTRACTION      There were no vitals filed for this visit.  Subjective Assessment - 05/11/17 0801    Subjective  Pt reports she was in contact with MD office who told her to hold off on JAS brace until after she sees MD tomorrow.    Pertinent History  03/04/17 - L bicondylar tibial plateau fracture s/p ORIF, L lateral meniscus tear s/p repair & L anterior compartment fasciotomy; 1995 - L ACL tear & tibial plateau fracture s/p surgical repair & MUA; R tibial plateau fracture; 2010 - R THA d/t avascular necrosis    Patient Stated Goals  "get it to bend"    Currently in Pain?  No/denies    Pain Score  0-No pain         [facility] PT Assessment - 05/11/17 0757      Assessment   Medical Diagnosis  L bicondylar tibial plateau fracture s/p ORIF, L lateral meniscus tear s/p repair & L anterior compartment fasciotomy    Referring Provider  Myrene Galas, MD    Onset Date/Surgical Date  03/04/17 date of injury - 02/22/17, surgery - 03/04/17    Next MD Visit  05/12/17      Restrictions   LLE Weight Bearing  Weight bearing as tolerated      AROM   Left Knee Extension  0 6 dg quad lag with LAQ    Left Knee Flexion  94      PROM   Left Knee Extension  0    Left Knee Flexion  97      Strength   Strength Assessment Site  Hip;Knee    Right/Left Hip  Right;Left    Right Hip Flexion  4/5    Right Hip Extension  4/5    Right Hip ABduction  4+/5    Right Hip ADduction  4/5    Left Hip Flexion  4-/5    Left Hip Extension  4-/5    Left Hip ABduction  4/5    Left Hip ADduction  4-/5    Right/Left Knee  Right;Left    Right Knee Flexion  4+/5    Right Knee Extension  4+/5    Left Knee Flexion  4-/5    Left Knee Extension  4-/5                  [facility] Adult PT Treatment/Exercise - 05/11/17 0757      Exercises   Exercises  Knee/Hip      Knee/Hip Exercises: Stretches   Gastroc Stretch  Left;30 seconds;2 reps    Gastroc Stretch Limitations  Prostretch      Knee/Hip Exercises: Aerobic   Nustep  lvl 5 x 6' - AAROM for knee flexion ROM       Knee/Hip Exercises: Standing   Heel Raises   Both;10 reps;3 seconds    Heel Raises Limitations  UE support on counter    Hip Flexion  Left;10 reps;Right;5 reps;Knee straight;Stengthening    Hip Flexion Limitations  red TB, 2 pole A    Hip ADduction  Left;10 reps;Right;5 reps;Strengthening    Hip ADduction Limitations  red TB, 2 pole A    Hip Abduction  Left;10 reps;Right;5 reps;Knee straight;Stengthening    Abduction Limitations  red TB, 2 pole A    Hip Extension  Left;10 reps;Right;5 reps;Knee straight;Stengthening    Extension Limitations  red TB, 2 pole A    Functional Squat  10 reps;5 seconds    Functional Squat Limitations  counter squat               PT Short Term Goals - 05/11/17 0802      PT SHORT TERM GOAL #1   Title  Independent with initial HEP    Status  Achieved      PT SHORT TERM GOAL #2   Title  L knee AROM 0-80 dg     Status  On-going        PT Long Term Goals - 03/23/17 0818      PT LONG TERM GOAL #1   Title  Independent with ongoing HEP    Status  On-going      PT LONG TERM GOAL #2   Title  L knee AROM 0-120     Status  On-going      PT LONG TERM GOAL #3   Title  L hip and knee strength >/= 4/5    Status  On-going      PT LONG TERM GOAL #4   Title  Pt will ambulate with normal gait pattern w/o AD on level surfaces    Status  On-going      PT LONG TERM GOAL #5   Title  Pt will ascend/descend a flight of stairs with good reciprocal pattern to allow increased ease of access to 2nd floor apt    Status  On-going  Plan - 05/11/17 0803    Clinical Impression Statement  Alice [name] reporting increasing comfort with ambulation WBAT, but still noted to revert to TDWB/NWB at times with crutches therefore worked on weaning to single axillary crutch on R while WBAT on L. Pt demonstrating tendency for increased lateral lean to R but able to demonstrate normal gait mechanics through swing phase of gait. L knee ROM continues to improve, currently 0-94 AROM with 6 dg quad lag and 0-97 PROM.  Pt gradually tolerating progression of strenghening exercises including introduction of closed chain strengthening. Alice [name] continues to have good potential to further benefit from skilled PT to restore functional ROM and strength. Pt has been fit for JAS flexion brace, but will await MD input from visit tomorrow before placing order.    Rehab Potential  Good    Clinical Impairments Affecting Rehab Potential  h/o L ACL tear & tibial plateau fracture s/p surgical repair & MUA; R tibial plateau fracture; 2010 - R THA d/t avascular necrosis    PT Treatment/Interventions  Patient/family education;Manual techniques;Passive range of motion;Scar mobilization;Dry needling;Taping;Therapeutic exercise;Neuromuscular re-education;Therapeutic activities;Functional mobility training;Gait training;Stair training;Moist Heat;Electrical Stimulation;Cryotherapy;Vasopneumatic Device;Ultrasound;Iontophoresis 4mg /ml Dexamethasone;ADLs/Self Care Home Management    PT Next Visit Plan  progress to WBAT in brace; weaning out of brace in two more weeks (~05/12/17)    Consulted and Agree with Plan of Care  Patient       Patient will benefit from skilled therapeutic intervention in order to improve the following deficits and impairments:  Pain, Decreased range of motion, Impaired flexibility, Increased muscle spasms, Increased fascial restricitons, Decreased strength, Abnormal gait, Difficulty walking, Decreased activity tolerance, Decreased balance, Decreased scar mobility  Visit Diagnosis: Stiffness of left knee, not elsewhere classified  Acute pain of left knee  Difficulty in walking, not elsewhere classified  Other abnormalities of gait and mobility     Problem List Patient Active Problem List   Diagnosis Date Noted  . Nicotine dependence 03/06/2017  . Bipolar II disorder (HCC)   . Closed bicondylar fracture of left tibial plateau 03/04/2017  . Avascular necrosis of bone of hip (HCC) 01/14/2017  . Chronic hip  pain 01/14/2017  . ADD (attention deficit disorder) 11/10/2016  . 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 Personal history of hyperthyroidism 07/16/2011  . Knee pain 05/06/2011    Alice [name], PT, MPT 05/11/2017, 10:20 AM  [facility] [address]  [address] [place], [place], [address] Phone: [phone]   Fax:  [phone]  Name: Alice [name] MRN: [id number] Date of Birth: October 05, 1975

## 2017-05-14 ENCOUNTER — Ambulatory Visit: Payer: 59

## 2017-05-17 ENCOUNTER — Encounter: Payer: Self-pay | Admitting: Physical Therapy

## 2017-05-17 ENCOUNTER — Ambulatory Visit: Payer: 59 | Attending: Orthopedic Surgery | Admitting: Physical Therapy

## 2017-05-17 DIAGNOSIS — M25562 Pain in left knee: Secondary | ICD-10-CM | POA: Insufficient documentation

## 2017-05-17 DIAGNOSIS — R2689 Other abnormalities of gait and mobility: Secondary | ICD-10-CM | POA: Insufficient documentation

## 2017-05-17 DIAGNOSIS — R262 Difficulty in walking, not elsewhere classified: Secondary | ICD-10-CM | POA: Insufficient documentation

## 2017-05-17 DIAGNOSIS — M25662 Stiffness of left knee, not elsewhere classified: Secondary | ICD-10-CM | POA: Insufficient documentation

## 2017-05-17 NOTE — Therapy (Addendum)
[facility] [address]  [address] [place], [place], [address] Phone: [phone]   Fax:  [phone]  Physical Therapy Treatment  Patient Details  Name: Alice [name] MRN: [id number] Date of Birth: 1975/12/27 Referring Provider: Myrene GalasMichael Handy, MD   Encounter Date: 05/17/2017  PT End of Session - 05/17/17 0802    Visit Number  16    Number of Visits  26    Date for PT Re-Evaluation  06/14/17    Authorization Type  [employer] (benefit period July - June)    Authorization - Number of Visits  27 VL 30 - [facility]    PT Start Time  0802    PT Stop Time  0845    PT Time Calculation (min)  43 min    Activity Tolerance  Patient tolerated treatment well    Behavior During Therapy  [facility] for tasks assessed/performed       Past Medical History:  Diagnosis Date  . ADHD (attention deficit hyperactivity disorder)   . Avascular necrosis of bone of hip (HCC)    RIGHT GRAFT  . Bipolar II disorder (HCC)   . Depression   . Nicotine dependence 03/06/2017  . PONV (postoperative nausea and vomiting)   . Wears glasses     Past Surgical History:  Procedure Laterality Date  . APPENDECTOMY  2000  . BREAST SURGERY  06/2009   RIGHT BREAST BIOPSY-NEGATIVE  . DILATION AND CURETTAGE OF UTERUS    . FASCIOTOMY Left 03/04/2017   Procedure: ANTERIOR COMPARTMENT FASCIOTOMY;  Surgeon: Myrene GalasHandy, Michael, MD;  Location: [facility] OR;  Service: Orthopedics;  Laterality: Left;  . HIP ARTHROSCOPY  2008, 2010   AVASCULAR NECROSIS--RIGHT HIP 2010  . KNEE SURGERY  1995   ACL  . ORIF TIBIA PLATEAU Left 03/04/2017   Procedure: OPEN REDUCTION INTERNAL FIXATION (ORIF) TIBIAL PLATEAU;  Surgeon: Myrene GalasHandy, Michael, MD;  Location: [facility] OR;  Service: Orthopedics;  Laterality: Left;  . SHOULDER SURGERY  [id number],2003.2008  . TONSILLECTOMY    . WISDOM TOOTH EXTRACTION      There were no vitals filed for this visit.  Subjective Assessment - 05/17/17 0806    Subjective   MD pleased with her progress, but does want her to go ahead with the JAS brace and expects another 30 dg by her next MD appt in 4 weeks.    Pertinent History  03/04/17 - L bicondylar tibial plateau fracture s/p ORIF, L lateral meniscus tear s/p repair & L anterior compartment fasciotomy; 1995 - L ACL tear & tibial plateau fracture s/p surgical repair & MUA; R tibial plateau fracture; 2010 - R THA d/t avascular necrosis    Patient Stated Goals  "get it to bend"    Currently in Pain?  Yes    Pain Score  1     Pain Location  Tibia    Pain Orientation  Left;Upper    Pain Descriptors / Indicators  Aching    Pain Frequency  Intermittent         [facility] PT Assessment - 05/17/17 0802      Assessment   Next MD Visit  06/17/17                  [facility] Treatment/Exercise - 05/17/17 0802      Ambulation/Gait   Ambulation/Gait Assistance  5: Supervision    Ambulation/Gait Assistance Details  Cues to avoid lateral lean to L as well as  adjusted crutches to promote neutral alignment and even weight bearing.    Ambulation Distance (Feet)  150 Feet    Assistive device  R Axillary Crutch    Gait Pattern  Step-through pattern;Decreased weight shift to left;Decreased stance time - left;Lateral trunk lean to right    Ambulation Surface  Level;Indoor      Exercises   Exercises  Knee/Hip      Knee/Hip Exercises: Stretches   ITB Stretch  Left;30 seconds;2 reps    ITB Stretch Limitations  standing at wall    Gastroc Stretch  Left;30 seconds;3 reps    Gastroc Stretch Limitations  Prostretch    Other Knee/Hip Stretches  Roller stick to L lateral quads/ITB x60'      Knee/Hip Exercises: Aerobic   Recumbent Bike  full revolutions x 6'      Knee/Hip Exercises: Machines for Strengthening   Cybex Leg Press  B LE 15# 2x10; B ankle press 15# x15      Knee/Hip Exercises: Standing   Terminal Knee Extension  Left;15 reps;Theraband;Strengthening    Theraband Level (Terminal Knee Extension)   Level 4 (Blue)    Terminal Knee Extension Limitations  pt reporting anterior knee pain on knee bend - relieved with PT providing medial patellar glide      Knee/Hip Exercises: Supine   Bridges with Beacher May  Both;15 reps             PT Education - 05/17/17 0845    Education provided  Yes    Education Details  HEP update    Person(s) Educated  Patient    Methods  Explanation;Demonstration;Handout    Comprehension  Verbalized understanding;Returned demonstration       PT Short Term Goals - 05/17/17 0807      PT SHORT TERM GOAL #1   Title  Independent with initial HEP    Status  Achieved      PT SHORT TERM GOAL #2   Title  L knee AROM 0-80 dg     Status  Achieved        PT Long Term Goals - 03/23/17 0818      PT LONG TERM GOAL #1   Title  Independent with ongoing HEP    Status  On-going      PT LONG TERM GOAL #2   Title  L knee AROM 0-120     Status  On-going      PT LONG TERM GOAL #3   Title  L hip and knee strength >/= 4/5    Status  On-going      PT LONG TERM GOAL #4   Title  Pt will ambulate with normal gait pattern w/o AD on level surfaces    Status  On-going      PT LONG TERM GOAL #5   Title  Pt will ascend/descend a flight of stairs with good reciprocal pattern to allow increased ease of access to 2nd floor apt    Status  On-going            Plan - 05/17/17 0845    Clinical Impression Statement  Pt reporting MD pleased with ROM progress but still wanting her to proceed with JAS flexion brace as he is expecting another 30 dg by her next MD f/u appt in ~4 weeks. Pt has notified JAS rep but still awaiting notification that the brace has arrived. Pt continuing to demonstrate lateral lean with gait with single axillary crutch, therefore adjustments made to crutch  height and mirror used for visual feedback to promote more upright posture with even weight bearing. Continued progression of weight bearing strengthening, with pt noting limited tolerance  for TKE due to anterior knee pain but relieved with medial patellar glide. Reviewed lateral quad and ITB rolling/stretching and added standing ITB to promote improved flexibility.    Rehab Potential  Good    Clinical Impairments Affecting Rehab Potential  h/o L ACL tear & tibial plateau fracture s/p surgical repair & MUA; R tibial plateau fracture; 2010 - R THA d/t avascular necrosis    PT Treatment/Interventions  Patient/family education;Manual techniques;Passive range of motion;Scar mobilization;Dry needling;Taping;Therapeutic exercise;Neuromuscular re-education;Therapeutic activities;Functional mobility training;Gait training;Stair training;Moist Heat;Electrical Stimulation;Cryotherapy;Vasopneumatic Device;Ultrasound;Iontophoresis 4mg /ml Dexamethasone;ADLs/Self Care Home Management    PT Next Visit Plan  progress to WBAT in brace; weaning out of brace in two more weeks (~05/12/17)    Consulted and Agree with Plan of Care  Patient       Patient will benefit from skilled therapeutic intervention in order to improve the following deficits and impairments:  Pain, Decreased range of motion, Impaired flexibility, Increased muscle spasms, Increased fascial restricitons, Decreased strength, Abnormal gait, Difficulty walking, Decreased activity tolerance, Decreased balance, Decreased scar mobility  Visit Diagnosis: Stiffness of left knee, not elsewhere classified  Acute pain of left knee  Difficulty in walking, not elsewhere classified  Other abnormalities of gait and mobility     Problem List Patient Active Problem List   Diagnosis Date Noted  . Nicotine dependence 03/06/2017  . Bipolar II disorder (HCC)   . Closed bicondylar fracture of left tibial plateau 03/04/2017  . Avascular necrosis of bone of hip (HCC) 01/14/2017  . Chronic hip pain 01/14/2017  . ADD (attention deficit disorder) 11/10/2016  . 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 Personal history of hyperthyroidism 07/16/2011   . Knee pain 05/06/2011    Marry GuanJoAnne M Shilpa Bushee, PT, MPT 05/17/2017, 12:20 PM  [facility] [address]  [address] [place], [place], [address] Phone: [phone]   Fax:  [phone]  Name: Alice [name] MRN: [id number] Date of Birth: [birth year]

## 2017-05-20 ENCOUNTER — Ambulatory Visit: Payer: 59

## 2017-05-20 DIAGNOSIS — R2689 Other abnormalities of gait and mobility: Secondary | ICD-10-CM

## 2017-05-20 DIAGNOSIS — R262 Difficulty in walking, not elsewhere classified: Secondary | ICD-10-CM

## 2017-05-20 DIAGNOSIS — M25662 Stiffness of left knee, not elsewhere classified: Secondary | ICD-10-CM | POA: Diagnosis not present

## 2017-05-20 DIAGNOSIS — M25562 Pain in left knee: Secondary | ICD-10-CM

## 2017-05-20 NOTE — Therapy (Signed)
[facility] [address]  [address] [place], [place], [address] Phone: [phone]   Fax:  [phone]  Physical Therapy Treatment  Patient Details  Name: Alice [name] MRN: [id number] Date of Birth: Dec 13, 1975 Referring Provider: Myrene GalasMichael Handy, MD   Encounter Date: 05/20/2017  PT End of Session - 05/20/17 0804    Visit Number  17    Number of Visits  26    Date for PT Re-Evaluation  06/14/17    Authorization Type  [employer] (benefit period July - June)    Authorization - Number of Visits  27 VL 30 - 3 visits used with [facility] PT    PT Start Time  0800    PT Stop Time  0843    PT Time Calculation (min)  43 min    Activity Tolerance  Patient tolerated treatment well    Behavior During Therapy  [facility] for tasks assessed/performed       Past Medical History:  Diagnosis Date  . ADHD (attention deficit hyperactivity disorder)   . Avascular necrosis of bone of hip (HCC)    RIGHT GRAFT  . Bipolar II disorder (HCC)   . Depression   . Nicotine dependence 03/06/2017  . PONV (postoperative nausea and vomiting)   . Wears glasses     Past Surgical History:  Procedure Laterality Date  . APPENDECTOMY  2000  . BREAST SURGERY  06/2009   RIGHT BREAST BIOPSY-NEGATIVE  . DILATION AND CURETTAGE OF UTERUS    . FASCIOTOMY Left 03/04/2017   Procedure: ANTERIOR COMPARTMENT FASCIOTOMY;  Surgeon: Myrene GalasHandy, Michael, MD;  [facility];  Service: Orthopedics;  Laterality: Left;  . HIP ARTHROSCOPY  2008, 2010   AVASCULAR NECROSIS--RIGHT HIP 2010  . KNEE SURGERY  1995   ACL  . ORIF TIBIA PLATEAU Left 03/04/2017   Procedure: OPEN REDUCTION INTERNAL FIXATION (ORIF) TIBIAL PLATEAU;  Surgeon: Myrene GalasHandy, Michael, MD;  Location: [facility] OR;  Service: Orthopedics;  Laterality: Left;  . SHOULDER SURGERY  [phone],2003.2008  . TONSILLECTOMY    . WISDOM TOOTH EXTRACTION      There were no vitals filed for this visit.  Subjective Assessment - 05/20/17 0803    Subjective   Alice [name] reporting she missed a call from JAS brace rep yesterday and plans to return call and schedule rep meet this upcoming week.      Pertinent History  03/04/17 - L bicondylar tibial plateau fracture s/p ORIF, L lateral meniscus tear s/p repair & L anterior compartment fasciotomy; 1995 - L ACL tear & tibial plateau fracture s/p surgical repair & MUA; R tibial plateau fracture; 2010 - R THA d/t avascular necrosis    Patient Stated Goals  "get it to bend"    Currently in Pain?  No/denies    Pain Score  0-No pain    Multiple Pain Sites  No                      [facility] Adult PT Treatment/Exercise - 05/20/17 0811      Knee/Hip Exercises: Stretches   Gastroc Stretch  Left;30 seconds;2 reps    Gastroc Stretch Limitations  on edge of UBE       Knee/Hip Exercises: Aerobic   Recumbent Bike  full revolutions x 6'      Knee/Hip Exercises: Standing   Heel Raises  Both;3 seconds;15 reps    Terminal Knee Extension  Left;15 reps;Theraband;Strengthening    Theraband Level (Terminal Knee Extension)  Level 4 (Blue)    Terminal Knee Extension Limitations  pain free today    Functional Squat  10 reps;5 seconds    Functional Squat Limitations  TM UE support     Wall Squat  10 reps;3 seconds;1 set    Wall Squat Limitations  with adduction ball squeeze     Other Standing Knee Exercises  L 6" bolster step over with TKE 3" x 10 reps; 1 chair support       Knee/Hip Exercises: Supine   Straight Leg Raises  Left;10 reps;Strengthening    Straight Leg Raises Limitations  3#       Manual Therapy   Manual Therapy  Passive ROM;Joint mobilization    Manual therapy comments  supine     Joint Mobilization  supine L knee A/P mobs grade III in vary angles of flexion for improved ROM    Passive ROM  manual L knee flexion stretch with contract/relax stretch at end ROM               PT Short Term Goals - 05/17/17 16100807      PT SHORT TERM GOAL #1   Title  Independent with initial HEP    Status   Achieved      PT SHORT TERM GOAL #2   Title  L knee AROM 0-80 dg     Status  Achieved        PT Long Term Goals - 03/23/17 0818      PT LONG TERM GOAL #1   Title  Independent with ongoing HEP    Status  On-going      PT LONG TERM GOAL #2   Title  L knee AROM 0-120     Status  On-going      PT LONG TERM GOAL #3   Title  L hip and knee strength >/= 4/5    Status  On-going      PT LONG TERM GOAL #4   Title  Pt will ambulate with normal gait pattern w/o AD on level surfaces    Status  On-going      PT LONG TERM GOAL #5   Title  Pt will ascend/descend a flight of stairs with good reciprocal pattern to allow increased ease of access to 2nd floor apt    Status  On-going            Plan - 05/20/17 0805    Clinical Impression Statement  Alice [name] reporting she has yet to return missed call from JAS brace rep after missing him yesterday.  Plans to contact rep for meeting in therapy this upcoming week.  Still requiring some cueing with gait with single axillary crutch today to prevent lateral lean.  Tolerated all ROM and strengthening activities in treatment well today including TKE with band without knee pain.  Will continue to progress ROM strengthening as pt. able in coming visits.       Clinical Impairments Affecting Rehab Potential  h/o L ACL tear & tibial plateau fracture s/p surgical repair & MUA; R tibial plateau fracture; 2010 - R THA d/t avascular necrosis    PT Treatment/Interventions  Patient/family education;Manual techniques;Passive range of motion;Scar mobilization;Dry needling;Taping;Therapeutic exercise;Neuromuscular re-education;Therapeutic activities;Functional mobility training;Gait training;Stair training;Moist Heat;Electrical Stimulation;Cryotherapy;Vasopneumatic Device;Ultrasound;Iontophoresis 4mg /ml Dexamethasone;ADLs/Self Care Home Management    PT Next Visit Plan  progress to WBAT in brace; weaning out of brace in two more weeks (~05/12/17)    Consulted and  Agree with Plan of Care  Patient  Patient will benefit from skilled therapeutic intervention in order to improve the following deficits and impairments:  Pain, Decreased range of motion, Impaired flexibility, Increased muscle spasms, Increased fascial restricitons, Decreased strength, Abnormal gait, Difficulty walking, Decreased activity tolerance, Decreased balance, Decreased scar mobility  Visit Diagnosis: Stiffness of left knee, not elsewhere classified  Acute pain of left knee  Difficulty in walking, not elsewhere classified  Other abnormalities of gait and mobility     Problem List Patient Active Problem List   Diagnosis Date Noted  . Nicotine dependence 03/06/2017  . Bipolar II disorder (HCC)   . Closed bicondylar fracture of left tibial plateau 03/04/2017  . Avascular necrosis of bone of hip (HCC) 01/14/2017  . Chronic hip pain 01/14/2017  . ADD (attention deficit disorder) 11/10/2016  . 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 Personal history of hyperthyroidism 07/16/2011  . Knee pain 05/06/2011    Kermit Balo, PTA 05/20/17 1:05 PM  [facility] [address]  [address] [place], [place], [address] Phone: [phone]   Fax:  [phone]  Name: Alice [name] MRN: [id number] Date of Birth: 29-Apr-1976

## 2017-05-26 ENCOUNTER — Ambulatory Visit: Payer: 59 | Admitting: Physical Therapy

## 2017-05-26 ENCOUNTER — Encounter: Payer: Self-pay | Admitting: Physical Therapy

## 2017-05-26 DIAGNOSIS — M25662 Stiffness of left knee, not elsewhere classified: Secondary | ICD-10-CM

## 2017-05-26 DIAGNOSIS — R2689 Other abnormalities of gait and mobility: Secondary | ICD-10-CM

## 2017-05-26 DIAGNOSIS — M25562 Pain in left knee: Secondary | ICD-10-CM

## 2017-05-26 DIAGNOSIS — R262 Difficulty in walking, not elsewhere classified: Secondary | ICD-10-CM

## 2017-05-26 NOTE — Therapy (Signed)
[facility] [address]  [address] [place], [place], [address] Phone: [phone]   Fax:  [phone]  Physical Therapy Treatment  Patient Details  Name: Alice CreaseMargaret M Mijangos MRN: [id number] Date of Birth: 1976-01-21 Referring Provider: Myrene GalasMichael Handy, MD   Encounter Date: 05/26/2017  PT End of Session - 05/26/17 0926    Visit Number  18    Number of Visits  26    Date for PT Re-Evaluation  06/14/17    Authorization Type  [employer] (benefit period July - June)    Authorization - Number of Visits  27 VL 30 - 3 visits [facility] with [facility]    PT Start Time  0926    PT Stop Time  1014    PT Time Calculation (min)  48 min    Activity Tolerance  Patient tolerated treatment well    Behavior During Therapy  [facility] for tasks assessed/performed       Past Medical History:  Diagnosis Date  . ADHD (attention deficit hyperactivity disorder)   . Avascular necrosis of bone of hip (HCC)    RIGHT GRAFT  . Bipolar II disorder (HCC)   . Depression   . Nicotine dependence 03/06/2017  . PONV (postoperative nausea and vomiting)   . Wears glasses     Past Surgical History:  Procedure Laterality Date  . APPENDECTOMY  2000  . BREAST SURGERY  06/2009   RIGHT BREAST BIOPSY-NEGATIVE  . DILATION AND CURETTAGE OF UTERUS    . FASCIOTOMY Left 03/04/2017   Procedure: ANTERIOR COMPARTMENT FASCIOTOMY;  Surgeon: Myrene GalasHandy, Michael, MD;  [facility];  Service: Orthopedics;  Laterality: Left;  . HIP ARTHROSCOPY  2008, 2010   AVASCULAR NECROSIS--RIGHT HIP 2010  . KNEE SURGERY  1995   ACL  . ORIF TIBIA PLATEAU Left 03/04/2017   Procedure: OPEN REDUCTION INTERNAL FIXATION (ORIF) TIBIAL PLATEAU;  Surgeon: Myrene GalasHandy, Michael, MD;  Location: [facility] OR;  Service: Orthopedics;  Laterality: Left;  . SHOULDER SURGERY  [phone] [id number],2003.2008  . TONSILLECTOMY    . WISDOM TOOTH EXTRACTION      There were no vitals filed for this visit.  Subjective Assessment - 05/26/17 0929    Subjective  JAS rep meeting pt in clinic for delivery and instruction in use of JAS knee flexion brace.    Pertinent History  03/04/17 - L bicondylar tibial plateau fracture s/p ORIF, L lateral meniscus tear s/p repair & L anterior compartment fasciotomy; 1995 - L ACL tear & tibial plateau fracture s/p surgical repair & MUA; R tibial plateau fracture; 2010 - R THA d/t avascular necrosis    Patient Stated Goals  "get it to bend"    Currently in Pain?  No/denies    Pain Score  0-No pain                      [facility] Adult PT Treatment/Exercise - 05/26/17 0926      Ambulation/Gait   Ambulation/Gait Assistance  5: Supervision    Ambulation Distance (Feet)  90 Feet    Assistive device  None    Gait Pattern  Step-through pattern;Decreased weight shift to left;Decreased stance time - left;Decreased step length - right    Ambulation Surface  Level;Indoor    Gait Comments  Pt able to correct to more even step length as walking progressed but still hesistant to shift weight fully to L.      Exercises   Exercises  Knee/Hip  Knee/Hip Exercises: Aerobic   Recumbent Bike  L1 x 6'      Knee/Hip Exercises: Standing   Forward Lunges  Both;10 reps;3 seconds    Forward Lunges Limitations  cues to avoid fwd knee anterior to toes    Side Lunges  Both;10 reps;3 seconds    Side Lunges Limitations  TRX; cues for aligning hip & knee over foot with leading leg    Lateral Step Up  Left;10 reps;Step Height: 6";Hand Hold: 2    Forward Step Up  Left;10 reps;Step Height: 6";Hand Hold: 2    Forward Step Up Limitations  cues to avoid hip circumduction and focus on hip & knee flexion to place foot on step    Functional Squat  10 reps;3 seconds;2 sets    Functional Squat Limitations  TRX; 2nd set + heel raise upon rising to stand               PT Short Term Goals - 05/17/17 1610      PT SHORT TERM GOAL #1   Title  Independent with initial HEP    Status  Achieved      PT SHORT TERM GOAL  #2   Title  L knee AROM 0-80 dg     Status  Achieved        PT Long Term Goals - 03/23/17 0818      PT LONG TERM GOAL #1   Title  Independent with ongoing HEP    Status  On-going      PT LONG TERM GOAL #2   Title  L knee AROM 0-120     Status  On-going      PT LONG TERM GOAL #3   Title  L hip and knee strength >/= 4/5    Status  On-going      PT LONG TERM GOAL #4   Title  Pt will ambulate with normal gait pattern w/o AD on level surfaces    Status  On-going      PT LONG TERM GOAL #5   Title  Pt will ascend/descend a flight of stairs with good reciprocal pattern to allow increased ease of access to 2nd floor apt    Status  On-going            Plan - 05/26/17 1001    Clinical Impression Statement  JAS rep present to issue brace and provide instruction in application, use and wearing schedule (30 minute - 3x/day) with pt acknowledging understanding. Continued strengthening progression with increasing emphasis on unilateral strengthening activities with good pt tolerance. Gait pattern becoming more fluid with single axillary crutch but contines to demonstrate increased lean to R. Provided gait training w/o crutches with pt initially demonstrating decreased weight shift to L and decreased stride length on R, but able to mostly correct with increased distance. Encouraged pt to walk around w/in home w/o crutches but continue to use single axillary crutch for community access espe while snow and ice remain. Pt with only 9 visits remaining in annual VL for insurance through June 2019, therefore pending progress over next 1-2 weeks, may consider reducing frequency to 1x/wk to conserve visits.    Clinical Impairments Affecting Rehab Potential  h/o L ACL tear & tibial plateau fracture s/p surgical repair & MUA; R tibial plateau fracture; 2010 - R THA d/t avascular necrosis    PT Treatment/Interventions  Patient/family education;Manual techniques;Passive range of motion;Scar  mobilization;Dry needling;Taping;Therapeutic exercise;Neuromuscular re-education;Therapeutic activities;Functional mobility training;Gait training;Stair training;Moist Heat;Electrical Stimulation;Cryotherapy;Vasopneumatic Device;Ultrasound;Iontophoresis  4mg /ml Dexamethasone;ADLs/Self Care Home Management    PT Next Visit Plan  progress to WBAT in brace; weaning out of brace in two more weeks (~05/12/17)    Consulted and Agree with Plan of Care  Patient       Patient will benefit from skilled therapeutic intervention in order to improve the following deficits and impairments:  Pain, Decreased range of motion, Impaired flexibility, Increased muscle spasms, Increased fascial restricitons, Decreased strength, Abnormal gait, Difficulty walking, Decreased activity tolerance, Decreased balance, Decreased scar mobility  Visit Diagnosis: Stiffness of left knee, not elsewhere classified  Acute pain of left knee  Difficulty in walking, not elsewhere classified  Other abnormalities of gait and mobility     Problem List Patient Active Problem List   Diagnosis Date Noted  . Nicotine dependence 03/06/2017  . Bipolar II disorder (HCC)   . Closed bicondylar fracture of left tibial plateau 03/04/2017  . Avascular necrosis of bone of hip (HCC) 01/14/2017  . Chronic hip pain 01/14/2017  . ADD (attention deficit disorder) 11/10/2016  . 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 Personal history of hyperthyroidism 07/16/2011  . Knee pain 05/06/2011    Marry GuanJoAnne M Haruka Kowaleski, PT, MPT 05/26/2017, 10:33 AM  [facility] [place] [address]  [address] [place], [place], [address] Phone: [phone]   Fax:  [phone]  Name: Alice CreaseMargaret M Ginger MRN: [id number] Date of Birth: 1975-12-14

## 2017-06-01 ENCOUNTER — Ambulatory Visit: Payer: 59 | Admitting: Physical Therapy

## 2017-06-02 ENCOUNTER — Ambulatory Visit: Payer: 59

## 2017-06-02 DIAGNOSIS — M25662 Stiffness of left knee, not elsewhere classified: Secondary | ICD-10-CM

## 2017-06-02 DIAGNOSIS — M25562 Pain in left knee: Secondary | ICD-10-CM

## 2017-06-02 DIAGNOSIS — R2689 Other abnormalities of gait and mobility: Secondary | ICD-10-CM

## 2017-06-02 DIAGNOSIS — R262 Difficulty in walking, not elsewhere classified: Secondary | ICD-10-CM

## 2017-06-02 NOTE — Therapy (Signed)
[facility] [place] [address]  [address] [place], [place], [address] Phone: [phone]   Fax:  [phone]  Physical Therapy Treatment  Patient Details  Name: Alice CreaseMargaret M [name] MRN: [id number] Date of Birth: [birth year] Referring Provider: Myrene GalasMichael Handy, MD   Encounter Date: 06/02/2017  PT End of Session - 06/02/17 0806    Visit Number  19    Number of Visits  26    Date for PT Re-Evaluation  06/14/17    Authorization Type  [employer] (benefit period July - June)    Authorization - Number of Visits  27 VL 30 - 3 visits used with [facility] PT    PT Start Time  0800    PT Stop Time  0844    PT Time Calculation (min)  44 min    Activity Tolerance  Patient tolerated treatment well    Behavior During Therapy  [facility] for tasks assessed/performed       Past Medical History:  Diagnosis Date  . ADHD (attention deficit hyperactivity disorder)   . Avascular necrosis of bone of hip (HCC)    RIGHT GRAFT  . Bipolar II disorder (HCC)   . Depression   . Nicotine dependence 03/06/2017  . PONV (postoperative nausea and vomiting)   . Wears glasses     Past Surgical History:  Procedure Laterality Date  . APPENDECTOMY  2000  . BREAST SURGERY  06/2009   RIGHT BREAST BIOPSY-NEGATIVE  . DILATION AND CURETTAGE OF UTERUS    . FASCIOTOMY Left 03/04/2017   Procedure: ANTERIOR COMPARTMENT FASCIOTOMY;  Surgeon: Myrene GalasHandy, Michael, MD;  [facility];  Service: Orthopedics;  Laterality: Left;  . HIP ARTHROSCOPY  2008, 2010   AVASCULAR NECROSIS--RIGHT HIP 2010  . KNEE SURGERY  1995   ACL  . ORIF TIBIA PLATEAU Left 03/04/2017   Procedure: OPEN REDUCTION INTERNAL FIXATION (ORIF) TIBIAL PLATEAU;  Surgeon: Myrene GalasHandy, Michael, MD;  Location: [facility] OR;  Service: Orthopedics;  Laterality: Left;  . SHOULDER SURGERY  [id number],2003.2008  . TONSILLECTOMY    . WISDOM TOOTH EXTRACTION      There were no vitals filed for this visit.  Subjective Assessment - 06/02/17 0801    Subjective  Pt. reporting she has been wearing JAS brace 1-3x/day for 30 min at a time since receiving.  Has been having some ankle "pain and snapping on L side" over two days however reports, "occasional ankle pain is normal for me because the doctor told me I don't have much cartilage left".      Pertinent History  03/04/17 - L bicondylar tibial plateau fracture s/p ORIF, L lateral meniscus tear s/p repair & L anterior compartment fasciotomy; 1995 - L ACL tear & tibial plateau fracture s/p surgical repair & MUA; R tibial plateau fracture; 2010 - R THA d/t avascular necrosis    Patient Stated Goals  "get it to bend"    Currently in Pain?  Yes    Pain Score  1     Pain Location  Ankle    Pain Orientation  Left;Medial;Anterior    Pain Descriptors / Indicators  Sharp    Pain Type  Acute pain    Pain Onset  More than a month ago    Pain Frequency  Intermittent         [facility] PT Assessment - 06/02/17 0807      Assessment   Next MD Visit  1.7.18      AROM   Left Knee Extension  0    Left Knee Flexion  108      PROM   Right/Left Knee  Left    Left Knee Extension  0    Left Knee Flexion  111                  [facility] Adult PT Treatment/Exercise - 06/02/17 0822      Ambulation/Gait   Ambulation/Gait  Yes    Ambulation/Gait Assistance  5: Supervision    Ambulation Distance (Feet)  90 Feet x 2 trials; 1st trial without axillary crutch; 2nd with crut    Assistive device  None    Gait Pattern  Step-through pattern;Decreased weight shift to left;Decreased stance time - left;Decreased step length - right    Ambulation Surface  Level;Indoor    Gait Comments  Somewhat increased "limp" today with wt. shift away from L due to reported L ankle pain when ambulating without AD; Able to demo even wt. shift ambulating with axillary crutch today       Knee/Hip Exercises: Aerobic   Recumbent Bike  L2 x 6'      Knee/Hip Exercises: Machines for Strengthening   Cybex Knee Flexion  B con/L ecc;  15# x 15 reps     Cybex Leg Press  B LE's 25# x 15 reps; L LE only 15# x 15 reps       Knee/Hip Exercises: Standing   Knee Flexion  Left;15 reps R SLS on airex pad     Knee Flexion Limitations  3#; at counter     Forward Lunges  Both;10 reps;3 seconds    Forward Lunges Limitations  Cues for dept     Side Lunges  Both;10 reps;3 seconds    Side Lunges Limitations  TRX; 8" bolster step-over     Functional Squat  3 seconds;15 reps;1 set    Functional Squat Limitations  TRX; mirror feedback for even wt. shift      Manual Therapy   Manual Therapy  Passive ROM;Joint mobilization    Manual therapy comments  supine     Joint Mobilization  supine L knee A/P mobs grade III in flexion ~ 60 dg on mat table for improved ROM    Passive ROM  manual L knee flexion stretch 5 x 10 sec                PT Short Term Goals - 05/17/17 16100807      PT SHORT TERM GOAL #1   Title  Independent with initial HEP    Status  Achieved      PT SHORT TERM GOAL #2   Title  L knee AROM 0-80 dg     Status  Achieved        PT Long Term Goals - 03/23/17 0818      PT LONG TERM GOAL #1   Title  Independent with ongoing HEP    Status  On-going      PT LONG TERM GOAL #2   Title  L knee AROM 0-120     Status  On-going      PT LONG TERM GOAL #3   Title  L hip and knee strength >/= 4/5    Status  On-going      PT LONG TERM GOAL #4   Title  Pt will ambulate with normal gait pattern w/o AD on level surfaces    Status  On-going      PT LONG TERM GOAL #5  Title  Pt will ascend/descend a flight of stairs with good reciprocal pattern to allow increased ease of access to 2nd floor apt    Status  On-going            Plan - 06/02/17 0825    Clinical Impression Statement  Ginny seen to start treatment ambulating with axillary crutch on R and antalgic gait.  Primary complaint today was L ankle pain when bearing wt.  Reports ankle pain has only been present over past two days however noting, "my doctor  told me I don't have much cartilage left in my L ankle".  Pt. encouraged to ice and wear ankle brace on L ankle for added support.  Ginny able to demo some improved ROM today with L knee AROM 0-108 dg and PROM 0-111 dg.  Tolerated mild progression in standing strengthening activities more limited by L ankle pain today with therex.  Pt. reports she is wearing JAS brace 1-3x/day.  Will continue to progress ROM and strengthening activities as pt. able in coming visits.      Clinical Impairments Affecting Rehab Potential  h/o L ACL tear & tibial plateau fracture s/p surgical repair & MUA; R tibial plateau fracture; 2010 - R THA d/t avascular necrosis    PT Treatment/Interventions  Patient/family education;Manual techniques;Passive range of motion;Scar mobilization;Dry needling;Taping;Therapeutic exercise;Neuromuscular re-education;Therapeutic activities;Functional mobility training;Gait training;Stair training;Moist Heat;Electrical Stimulation;Cryotherapy;Vasopneumatic Device;Ultrasound;Iontophoresis 4mg /ml Dexamethasone;ADLs/Self Care Home Management    Consulted and Agree with Plan of Care  Patient       Patient will benefit from skilled therapeutic intervention in order to improve the following deficits and impairments:  Pain, Decreased range of motion, Impaired flexibility, Increased muscle spasms, Increased fascial restricitons, Decreased strength, Abnormal gait, Difficulty walking, Decreased activity tolerance, Decreased balance, Decreased scar mobility  Visit Diagnosis: Stiffness of left knee, not elsewhere classified  Acute pain of left knee  Difficulty in walking, not elsewhere classified  Other abnormalities of gait and mobility     Problem List Patient Active Problem List   Diagnosis Date Noted  . Nicotine dependence 03/06/2017  . Bipolar II disorder (HCC)   . Closed bicondylar fracture of left tibial plateau 03/04/2017  . Avascular necrosis of bone of hip (HCC) 01/14/2017  .  Chronic hip pain 01/14/2017  . ADD (attention deficit disorder) 11/10/2016  . 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 Personal history of hyperthyroidism 07/16/2011  . Knee pain 05/06/2011    Kermit Balo, PTA 06/02/17 3:07 PM  [facility] [address]  [address] [place], [place], [address] Phone: [phone]   Fax:  [phone]  Name: SAMIAH RICKLEFS MRN: [id number] Date of Birth: 1975-10-21

## 2017-06-16 ENCOUNTER — Encounter: Payer: Self-pay | Admitting: Physical Therapy

## 2017-06-16 ENCOUNTER — Ambulatory Visit: Payer: 59 | Attending: Orthopedic Surgery | Admitting: Physical Therapy

## 2017-06-16 DIAGNOSIS — R2689 Other abnormalities of gait and mobility: Secondary | ICD-10-CM | POA: Insufficient documentation

## 2017-06-16 DIAGNOSIS — M25562 Pain in left knee: Secondary | ICD-10-CM | POA: Diagnosis present

## 2017-06-16 DIAGNOSIS — R262 Difficulty in walking, not elsewhere classified: Secondary | ICD-10-CM | POA: Insufficient documentation

## 2017-06-16 DIAGNOSIS — M25662 Stiffness of left knee, not elsewhere classified: Secondary | ICD-10-CM | POA: Insufficient documentation

## 2017-06-16 NOTE — Patient Instructions (Signed)

## 2017-06-16 NOTE — Therapy (Signed)
[facility] [address]  [address] [place], [place], [address] Phone: [phone]   Fax:  [phone]  Physical Therapy Treatment  Patient Details  Name: Alice [name] MRN: [id number] Date of Birth: 08-03-1975 Referring Provider: Myrene Galas, MD   Encounter Date: 06/16/2017  PT End of Session - 06/16/17 0848    Visit Number  20    Number of Visits  27    Date for PT Re-Evaluation  08/06/17    Authorization Type  [employer] (benefit period July - June)    Authorization - Number of Visits  27 VL 30 - 3 visits used with [facility] PT    PT Start Time  0848    PT Stop Time  0938    PT Time Calculation (min)  50 min    Activity Tolerance  Patient tolerated treatment well    Behavior During Therapy  [facility] for tasks assessed/performed       Past Medical History:  Diagnosis Date  . ADHD (attention deficit hyperactivity disorder)   . Avascular necrosis of bone of hip (HCC)    RIGHT GRAFT  . Bipolar II disorder (HCC)   . Depression   . Nicotine dependence 03/06/2017  . PONV (postoperative nausea and vomiting)   . Wears glasses     Past Surgical History:  Procedure Laterality Date  . APPENDECTOMY  2000  . BREAST SURGERY  06/2009   RIGHT BREAST BIOPSY-NEGATIVE  . DILATION AND CURETTAGE OF UTERUS    . FASCIOTOMY Left 03/04/2017   Procedure: ANTERIOR COMPARTMENT FASCIOTOMY;  Surgeon: Myrene Galas, MD;  Location: [facility] OR;  Service: Orthopedics;  Laterality: Left;  . HIP ARTHROSCOPY  2008, 2010   AVASCULAR NECROSIS--RIGHT HIP 2010  . KNEE SURGERY  1995   ACL  . ORIF TIBIA PLATEAU Left 03/04/2017   Procedure: OPEN REDUCTION INTERNAL FIXATION (ORIF) TIBIAL PLATEAU;  Surgeon: Myrene Galas, MD;  Location: [facility] OR;  Service: Orthopedics;  Laterality: Left;  . SHOULDER SURGERY  [phone]  . TONSILLECTOMY    . WISDOM TOOTH EXTRACTION      There were no vitals filed for this visit.  Subjective Assessment - 06/16/17 0853    Subjective   Pt reporting she feels like the knee bending is getting better, but feels limited by L ankle - still having pain and "snapping/popping" medially & dorsally- which has prevented her from weaning from the crutch. Pt also noting increased lateral ankle swelling.    Pertinent History  03/04/17 - L bicondylar tibial plateau fracture s/p ORIF, L lateral meniscus tear s/p repair & L anterior compartment fasciotomy; 1995 - L ACL tear & tibial plateau fracture s/p surgical repair & MUA; R tibial plateau fracture; 2010 - R THA d/t avascular necrosis    Patient Stated Goals  "get it to bend"    Currently in Pain?  Yes    Pain Score  4     Pain Location  Ankle    Pain Orientation  Left;Medial;Anterior    Pain Descriptors / Indicators  Aching "snapping"    Pain Type  Acute pain    Pain Onset  1 to 4 weeks ago    Pain Frequency  Constant    Aggravating Factors   weight bearing    Pain Relieving Factors  using B crutches    Effect of Pain on Daily Activities  limits walking         [facility] PT Assessment - 06/16/17 0848  Assessment   Medical Diagnosis  L bicondylar tibial plateau fracture s/p ORIF, L lateral meniscus tear s/p repair & L anterior compartment fasciotomy    Referring Provider  Myrene GalasMichael Handy, MD    Onset Date/Surgical Date  03/04/17 date of injury - 02/22/17, surgery - 03/04/17    Next MD Visit  06/21/17      Observation/Other Assessments   Focus on Therapeutic Outcomes (FOTO)   knee 53% (47% limitation)                  [facility] Adult PT Treatment/Exercise - 06/16/17 0848      Self-Care   Self-Care  Retrograde Massage    Retrograde Massage  Instructed pt in cross-friction massage +/- ice massage to tendons in L ankle as well as retrograde massage for edema manangement.      Exercises   Exercises  Knee/Hip      Knee/Hip Exercises: Aerobic   Recumbent Bike  L2 x 6'      Modalities   Modalities  Iontophoresis      Iontophoresis   Type of Iontophoresis  Dexamethasone     Location  L medial ankle (posterior tibialis tendon)    Dose  80 mA-min, 1.0 mL    Time  4-6 hr patch (#1 of 6)      Manual Therapy   Manual Therapy  Soft tissue mobilization    Soft tissue mobilization  cross-friction massage to L posterior & anterior tibialis tendons      Ankle Exercises: Seated   Other Seated Ankle Exercises  L ankle PF - green TB x15 (longsitting); DF/IV/EV with red TB x15 (heel resting on floor)      Ankle Exercises: Stretches   Other Stretch  L posterior tibialis stretch 2 x 30"             PT Education - 06/16/17 0938    Education provided  Yes    Education Details  HEP update - ankle stretches & strengthening; cross-friction &/or ice massage to posterior & anterior tibialis tendons; Iontophoresis patch instructions    Person(s) Educated  Patient    Methods  Explanation;Demonstration;Handout    Comprehension  Verbalized understanding;Returned demonstration;Need further instruction       PT Short Term Goals - 05/17/17 16100807      PT SHORT TERM GOAL #1   Title  Independent with initial HEP    Status  Achieved      PT SHORT TERM GOAL #2   Title  L knee AROM 0-80 dg     Status  Achieved        PT Long Term Goals - 03/23/17 0818      PT LONG TERM GOAL #1   Title  Independent with ongoing HEP    Status  On-going      PT LONG TERM GOAL #2   Title  L knee AROM 0-120     Status  On-going      PT LONG TERM GOAL #3   Title  L hip and knee strength >/= 4/5    Status  On-going      PT LONG TERM GOAL #4   Title  Pt will ambulate with normal gait pattern w/o AD on level surfaces    Status  On-going      PT LONG TERM GOAL #5   Title  Pt will ascend/descend a flight of stairs with good reciprocal pattern to allow increased ease of access to 2nd floor apt  Status  On-going            Plan - 06/16/17 0857    Clinical Impression Statement  Alice [name] reports inability to wean from R axillary crutch due to ongoing L ankle pain with  "snapping/popping" sensation, even returning to B axillary crutches at time. Pt ttp over L posterior and anterior tibialis tendons with palpable tendon "snapping" noted over anterior tib tendon with resisted eversion. While pt had been NWB on L she demostrated a tendency to hold her foot in an inverted/supinated position, which may have contributed to tightening of posterior tibialis and overstretching of anterior tibialis. Pt instructed in stretching for posterior tibialis as well as cross-friction +/- ice massage to restore normal tendon glide/motion, as well as theraband strengthening to restore muscle balance and support at ankle. Trial of ionto patch to L posterior tibialis tendon initiated today and may consider kinesiotaping on next visit to reduce inflammation and provide ankle support. Pt nearing insurance visit limit (20 of 27 through June 2019), therefore will plan to reduce frequency to 1x/wk as of next week for continued focus on functional ROM and strengthening with increased emphasis on HEP.    Rehab Potential  Good    Clinical Impairments Affecting Rehab Potential  h/o L ACL tear & tibial plateau fracture s/p surgical repair & MUA; R tibial plateau fracture; 2010 - R THA d/t avascular necrosis    PT Frequency  1x / week    PT Duration  6 weeks    PT Treatment/Interventions  Patient/family education;Manual techniques;Passive range of motion;Scar mobilization;Dry needling;Taping;Therapeutic exercise;Neuromuscular re-education;Therapeutic activities;Functional mobility training;Gait training;Stair training;Moist Heat;Electrical Stimulation;Cryotherapy;Vasopneumatic Device;Ultrasound;Iontophoresis 4mg /ml Dexamethasone;ADLs/Self Care Home Management    Consulted and Agree with Plan of Care  Patient       Patient will benefit from skilled therapeutic intervention in order to improve the following deficits and impairments:  Pain, Decreased range of motion, Impaired flexibility, Increased muscle  spasms, Increased fascial restricitons, Decreased strength, Abnormal gait, Difficulty walking, Decreased activity tolerance, Decreased balance, Decreased scar mobility  Visit Diagnosis: Stiffness of left knee, not elsewhere classified  Acute pain of left knee  Difficulty in walking, not elsewhere classified  Other abnormalities of gait and mobility     Problem List Patient Active Problem List   Diagnosis Date Noted  . Nicotine dependence 03/06/2017  . Bipolar II disorder (HCC)   . Closed bicondylar fracture of left tibial plateau 03/04/2017  . Avascular necrosis of bone of hip (HCC) 01/14/2017  . Chronic hip pain 01/14/2017  . ADD (attention deficit disorder) 11/10/2016  . 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 Personal history of hyperthyroidism 07/16/2011  . Knee pain 05/06/2011    Alice [name], PT, MPT 06/16/2017, 1:25 PM  [facility] [address]  [address] [place], [place], [address] Phone: [phone]   Fax:  [phone]  Name: Alice [name] MRN: [id number] Date of Birth: 05/18/1976

## 2017-06-18 ENCOUNTER — Ambulatory Visit: Payer: 59

## 2017-06-23 ENCOUNTER — Ambulatory Visit: Payer: 59 | Admitting: Physical Therapy

## 2017-06-25 ENCOUNTER — Ambulatory Visit: Payer: 59 | Admitting: Physical Therapy

## 2017-06-25 ENCOUNTER — Encounter: Payer: Self-pay | Admitting: Physical Therapy

## 2017-06-25 DIAGNOSIS — R262 Difficulty in walking, not elsewhere classified: Secondary | ICD-10-CM

## 2017-06-25 DIAGNOSIS — R2689 Other abnormalities of gait and mobility: Secondary | ICD-10-CM

## 2017-06-25 DIAGNOSIS — M25662 Stiffness of left knee, not elsewhere classified: Secondary | ICD-10-CM

## 2017-06-25 DIAGNOSIS — M25562 Pain in left knee: Secondary | ICD-10-CM

## 2017-06-25 NOTE — Therapy (Signed)
[facility] [address]  [address] [place], [place], [address] Phone: [phone]   Fax:  [phone]  Physical Therapy Treatment  Patient Details  Name: Alice [name] MRN: [id number] Date of Birth: February 14, 1976 Referring Provider: Myrene GalasMichael Handy, MD   Encounter Date: 06/25/2017  PT End of Session - 06/25/17 0843    Visit Number  21    Number of Visits  27    Date for PT Re-Evaluation  08/06/17    Authorization Type  [employer] (benefit period July - June)    Authorization - Number of Visits  27 VL 30 - [facility]    PT Start Time  0843    PT Stop Time  0947    PT Time Calculation (min)  64 min    Activity Tolerance  Patient tolerated treatment well    Behavior During Therapy  [facility] for tasks assessed/performed       Past Medical History:  Diagnosis Date  . ADHD (attention deficit hyperactivity disorder)   . Avascular necrosis of bone of hip (HCC)    RIGHT GRAFT  . Bipolar II disorder (HCC)   . Depression   . Nicotine dependence 03/06/2017  . PONV (postoperative nausea and vomiting)   . Wears glasses     Past Surgical History:  Procedure Laterality Date  . APPENDECTOMY  2000  . BREAST SURGERY  06/2009   RIGHT BREAST BIOPSY-NEGATIVE  . DILATION AND CURETTAGE OF UTERUS    . FASCIOTOMY Left 03/04/2017   Procedure: ANTERIOR COMPARTMENT FASCIOTOMY;  Surgeon: Myrene GalasHandy, Michael, MD;  [facility];  Service: Orthopedics;  Laterality: Left;  . HIP ARTHROSCOPY  2008, 2010   AVASCULAR NECROSIS--RIGHT HIP 2010  . KNEE SURGERY  1995   ACL  . ORIF TIBIA PLATEAU Left 03/04/2017   Procedure: OPEN REDUCTION INTERNAL FIXATION (ORIF) TIBIAL PLATEAU;  Surgeon: Myrene GalasHandy, Michael, MD;  Location: [facility] OR;  Service: Orthopedics;  Laterality: Left;  . SHOULDER SURGERY  [phone],2003.2008  . TONSILLECTOMY    . WISDOM TOOTH EXTRACTION      There were no vitals filed for this visit.  Subjective Assessment - 06/25/17 0850    Subjective   Pt reports she saw the MD earlier this week and he was pleased with her knee ROM progress. States MD feels ankle pain is tendinitis and told her she could continue to use the ionto patches and add ankle exercises to POC. Currrently no pain at knee but still limited by ankle.    Pertinent History  03/04/17 - L bicondylar tibial plateau fracture s/p ORIF, L lateral meniscus tear s/p repair & L anterior compartment fasciotomy; 1995 - L ACL tear & tibial plateau fracture s/p surgical repair & MUA; R tibial plateau fracture; 2010 - R THA d/t avascular necrosis    Patient Stated Goals  "get it to bend"    Currently in Pain?  Yes    Pain Score  5  while walking    Pain Location  Ankle    Pain Orientation  Left;Medial;Lateral;Anterior    Pain Descriptors / Indicators  -- "snapping"    Pain Type  Acute pain         [facility] PT Assessment - 06/25/17 0843      Assessment   Next MD Visit  6 wks PRN      AROM   Left Knee Flexion  130 grossly  [facility] Adult PT Treatment/Exercise - 06/25/17 0843      Ambulation/Gait   Ambulation Distance (Feet)  90 Feet    Assistive device  None    Gait Pattern  Step-through pattern;Decreased weight shift to left;Decreased stance time - left;Decreased step length - right L LE internally rotated with foot supinated    Ambulation Surface  Level;Indoor    Gait Comments  Cues for neutral hip and foot alignment. Pt continuing to use single axillary crutch outside of PT d/t ankle pain.      Exercises   Exercises  Knee/Hip      Knee/Hip Exercises: Aerobic   Recumbent Bike  L2 x 6'      Knee/Hip Exercises: Standing   SLS  L SLS (with R TTWB for balance) + B pallof press with red TB x10 each      Modalities   Modalities  Iontophoresis      Iontophoresis   Type of Iontophoresis  Dexamethasone    Location  L anterior ankle (anterior tibialis tendon)    Dose  80 mA-min, 1.0 mL    Time  4-6 hr patch (#2 of 6)      Manual Therapy   Manual  Therapy  Soft tissue mobilization;Passive ROM;Taping    Soft tissue mobilization  cross-friction massage to L peroneals, posterior & anterior tibialis tendons     Passive ROM  manual stretch for posterior tibialis tendon    Kinesiotex  Inhibit Muscle      Kinesiotix   Inhibit Muscle   L post tib - 30% along medial arch from 1st MTP around heel to lat malleolus, 30-50% stirrup, 30% from lateral foot anterior to malleolus wrapping under plantar surface following along post tib tendon      Ankle Exercises: Seated   BAPS  Sitting;Level 3;10 reps    BAPS Weights (lbs)  2.5 at P for DF/PF only    BAPS Limitations  DF/PF, IV/EV, CW/CCW    Other Seated Ankle Exercises  L ankle PF - green TB x15 (longsitting); DF/IV/EV with red TB x15 (heel resting on floor)               PT Short Term Goals - 05/17/17 1610      PT SHORT TERM GOAL #1   Title  Independent with initial HEP    Status  Achieved      PT SHORT TERM GOAL #2   Title  L knee AROM 0-80 dg     Status  Achieved        PT Long Term Goals - 03/23/17 0818      PT LONG TERM GOAL #1   Title  Independent with ongoing HEP    Status  On-going      PT LONG TERM GOAL #2   Title  L knee AROM 0-120     Status  On-going      PT LONG TERM GOAL #3   Title  L hip and knee strength >/= 4/5    Status  On-going      PT LONG TERM GOAL #4   Title  Pt will ambulate with normal gait pattern w/o AD on level surfaces    Status  On-going      PT LONG TERM GOAL #5   Title  Pt will ascend/descend a flight of stairs with good reciprocal pattern to allow increased ease of access to 2nd floor apt    Status  On-going  Plan - 06/25/17 0916    Clinical Impression Statement  Alice [name] returning to PT after seeing MD earlier this week, stating MD please with her knee ROM progress and feels tat her ankle pain issues are likely due to tendonitis and chronic laxity. L knee ROM now grossly 130 dg and pt reporting no recent pain issues  related to knee, but still having intermittent swelling. L ankle pain causing her to continue to rely on single axillary crutch with gait pattern demonstrating L LE IR and foot supination. Pt reporting ankle pain & snapping over peroneal tendons as well as anterior & posterior tibialis tendons. Initiated trial of kinesiotaping and focused on ankle proprioception and strengthening. Treatment concluded with application of 2nd ionto patch to promote further reduce inflamation.    Rehab Potential  Good    Clinical Impairments Affecting Rehab Potential  h/o L ACL tear & tibial plateau fracture s/p surgical repair & MUA; R tibial plateau fracture; 2010 - R THA d/t avascular necrosis    PT Frequency  1x / week    PT Duration  6 weeks    PT Treatment/Interventions  Patient/family education;Manual techniques;Passive range of motion;Scar mobilization;Dry needling;Taping;Therapeutic exercise;Neuromuscular re-education;Therapeutic activities;Functional mobility training;Gait training;Stair training;Moist Heat;Electrical Stimulation;Cryotherapy;Vasopneumatic Device;Ultrasound;Iontophoresis 4mg /ml Dexamethasone;ADLs/Self Care Home Management    Consulted and Agree with Plan of Care  Patient       Patient will benefit from skilled therapeutic intervention in order to improve the following deficits and impairments:  Pain, Decreased range of motion, Impaired flexibility, Increased muscle spasms, Increased fascial restricitons, Decreased strength, Abnormal gait, Difficulty walking, Decreased activity tolerance, Decreased balance, Decreased scar mobility  Visit Diagnosis: Stiffness of left knee, not elsewhere classified  Acute pain of left knee  Difficulty in walking, not elsewhere classified  Other abnormalities of gait and mobility     Problem List Patient Active Problem List   Diagnosis Date Noted  . Nicotine dependence 03/06/2017  . Bipolar II disorder (HCC)   . Closed bicondylar fracture of left  tibial plateau 03/04/2017  . Avascular necrosis of bone of hip (HCC) 01/14/2017  . Chronic hip pain 01/14/2017  . ADD (attention deficit disorder) 11/10/2016  . 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 Personal history of hyperthyroidism 07/16/2011  . Knee pain 05/06/2011    Marry Guan, PT, MPT 06/25/2017, 10:21 AM  [facility] [address]  [address] [place], [place], [address] Phone: [phone]   Fax:  [phone]  Name: Alice [name] MRN: [id number] Date of Birth: 07-Sep-1975

## 2017-06-28 ENCOUNTER — Ambulatory Visit: Payer: 59 | Admitting: Physical Therapy

## 2017-06-30 ENCOUNTER — Encounter: Payer: Self-pay | Admitting: Physical Therapy

## 2017-06-30 ENCOUNTER — Ambulatory Visit: Payer: 59 | Admitting: Physical Therapy

## 2017-06-30 DIAGNOSIS — M25562 Pain in left knee: Secondary | ICD-10-CM

## 2017-06-30 DIAGNOSIS — R2689 Other abnormalities of gait and mobility: Secondary | ICD-10-CM

## 2017-06-30 DIAGNOSIS — M25662 Stiffness of left knee, not elsewhere classified: Secondary | ICD-10-CM | POA: Diagnosis not present

## 2017-06-30 DIAGNOSIS — R262 Difficulty in walking, not elsewhere classified: Secondary | ICD-10-CM

## 2017-06-30 NOTE — Therapy (Addendum)
[facility] [address]  [address] [place], [place], [address] Phone: [phone]   Fax:  [phone]  Physical Therapy Treatment  Patient Details  Name: Alice CreaseMargaret M [name] MRN: [id number] Date of Birth: 1975-10-22 Referring Provider: Myrene GalasMichael Handy, MD   Encounter Date: 06/30/2017  PT End of Session - 06/30/17 0800    Visit Number  22    Number of Visits  27    Date for PT Re-Evaluation  08/06/17    Authorization Type  [employer] (benefit period July - June)    Authorization - Number of Visits  27 VL 30 - 3 visits used with HH PT    PT Start Time  0800    PT Stop Time  0848    PT Time Calculation (min)  48 min    Activity Tolerance  Patient tolerated treatment well    Behavior During Therapy  [facility] for tasks assessed/performed       Past Medical History:  Diagnosis Date  . ADHD (attention deficit hyperactivity disorder)   . Avascular necrosis of bone of hip (HCC)    RIGHT GRAFT  . Bipolar II disorder (HCC)   . Depression   . Nicotine dependence 03/06/2017  . PONV (postoperative nausea and vomiting)   . Wears glasses     Past Surgical History:  Procedure Laterality Date  . APPENDECTOMY  2000  . BREAST SURGERY  06/2009   RIGHT BREAST BIOPSY-NEGATIVE  . DILATION AND CURETTAGE OF UTERUS    . FASCIOTOMY Left 03/04/2017   Procedure: ANTERIOR COMPARTMENT FASCIOTOMY;  Surgeon: Myrene GalasHandy, Michael, MD;  [facility];  Service: Orthopedics;  Laterality: Left;  . HIP ARTHROSCOPY  2008, 2010   AVASCULAR NECROSIS--RIGHT HIP 2010  . KNEE SURGERY  1995   ACL  . ORIF TIBIA PLATEAU Left 03/04/2017   Procedure: OPEN REDUCTION INTERNAL FIXATION (ORIF) TIBIAL PLATEAU;  Surgeon: Myrene GalasHandy, Michael, MD;  Location: [facility] OR;  Service: Orthopedics;  Laterality: Left;  . SHOULDER SURGERY  [phone]  . TONSILLECTOMY    . WISDOM TOOTH EXTRACTION      There were no vitals filed for this visit.  Subjective Assessment - 06/30/17 0802    Subjective   Pt feels that ionto patch and taping definitely helped her ankle. Was able to go "walking in nature" over the weekend and has been trying to walk around her home w/o the crutch.    Pertinent History  03/04/17 - L bicondylar tibial plateau fracture s/p ORIF, L lateral meniscus tear s/p repair & L anterior compartment fasciotomy; 1995 - L ACL tear & tibial plateau fracture s/p surgical repair & MUA; R tibial plateau fracture; 2010 - R THA d/t avascular necrosis    Patient Stated Goals  "get it to bend"    Currently in Pain?  No/denies                      [facility] Adult PT Treatment/Exercise - 06/30/17 0800      Ambulation/Gait   Ambulation Distance (Feet)  120 Feet    Assistive device  None    Gait Pattern  Step-through pattern;Decreased weight shift to left;Decreased stance time - left;Decreased step length - right;Antalgic L LE mildly internally rotated with foot supinated    Ambulation Surface  Level;Unlevel    Gait Comments  Gait mechanics improving w/o AD with main issue now hesistancy to bear full weight with weight shift to L.      Exercises  Exercises  Knee/Hip      Knee/Hip Exercises: Stretches   Gastroc Stretch  Left;30 seconds;3 reps    [employer]er, [employer]      Knee/Hip Exercises: Aerobic   Recumbent Bike  L2 x 6'      Knee/Hip Exercises: Standing   Heel Raises  Both;10 reps;3 seconds;2 sets    Heel Raises Limitations  2nd set with L single leg eccentic lowering; standing on back of UBE    Step Down  Left;15 reps;Step Height: 4";Hand Hold: 2    Step Down Limitations  eccentric lowering with heel touch    SLS  L SLS on blue foam oval (with R TTWB for balance) + B pallof press with red TB x10 each    SLS with Vectors  L SLS + R clocks (12-3-6-9) x5 on ffirm surface, x10 on blue foam oval; 1 pole A    Other Standing Knee Exercises  B sidestepping with looped yellow TB at forefoot 2 x 91ft; fwd/back monster walk with looped red TB at ankles 2 x  80ft      Modalities   Modalities  Iontophoresis      Iontophoresis   Type of Iontophoresis  Dexamethasone    Location  L anterior/lateral ankle     Dose  80 mA-min, 1.0 mL    Time  4-6 hr patch (#3 of 6)      Manual Therapy   Manual Therapy  Taping    Kinesiotex  Inhibit Muscle      Kinesiotix   Inhibit Muscle   L post tib - 30% along medial arch from 1st MTP around heel to lat malleolus, 30-50% stirrup, 30% from lateral foot anterior to malleolus wrapping under plantar surface following along post tib tendon             PT Education - 06/30/17 0848    Education provided  Yes    Education Details  HEP update    Person(s) Educated  Patient    Methods  Explanation;Demonstration;Handout    Comprehension  Verbalized understanding;Returned demonstration       PT Short Term Goals - 05/17/17 0807      PT SHORT TERM GOAL #1   Title  Independent with initial HEP    Status  Achieved      PT SHORT TERM GOAL #2   Title  L knee AROM 0-80 dg     Status  Achieved        PT Long Term Goals - 03/23/17 0818      PT LONG TERM GOAL #1   Title  Independent with ongoing HEP    Status  On-going      PT LONG TERM GOAL #2   Title  L knee AROM 0-120     Status  On-going      PT LONG TERM GOAL #3   Title  L hip and knee strength >/= 4/5    Status  On-going      PT LONG TERM GOAL #4   Title  Pt will ambulate with normal gait pattern w/o AD on level surfaces    Status  On-going      PT LONG TERM GOAL #5   Title  Pt will ascend/descend a flight of stairs with good reciprocal pattern to allow increased ease of access to 2nd floor apt    Status  On-going            Plan - 06/30/17 1610  Clinical Impression Statement  Pt reporting significant impovement in pain after ionto patch and taping last visit, denying pain on arrival to PT today. Pt able to progress ankle/knee/hip strengthening, proprioceptive and stability training today with pt reporting no pain during  exercises, but occasional brief sharp pain in L lateral ankle when shifting away from weight bearing. Exercise tolerance also limited by fatigue. Given current 1x/wk frequency due to limited insurance visits remaining, HEP updated to inlcude some of activities completed during therapy session today. Treatment concluded with reapplication of kinesiotape & ionto patch given prior positive response.    Rehab Potential  Good    Clinical Impairments Affecting Rehab Potential  h/o L ACL tear & tibial plateau fracture s/p surgical repair & MUA; R tibial plateau fracture; 2010 - R THA d/t avascular necrosis    PT Frequency  1x / week    PT Duration  6 weeks    PT Treatment/Interventions  Patient/family education;Manual techniques;Passive range of motion;Scar mobilization;Dry needling;Taping;Therapeutic exercise;Neuromuscular re-education;Therapeutic activities;Functional mobility training;Gait training;Stair training;Moist Heat;Electrical Stimulation;Cryotherapy;Vasopneumatic Device;Ultrasound;Iontophoresis 4mg /ml Dexamethasone;ADLs/Self Care Home Management    Consulted and Agree with Plan of Care  Patient       Patient will benefit from skilled therapeutic intervention in order to improve the following deficits and impairments:  Pain, Decreased range of motion, Impaired flexibility, Increased muscle spasms, Increased fascial restricitons, Decreased strength, Abnormal gait, Difficulty walking, Decreased activity tolerance, Decreased balance, Decreased scar mobility  Visit Diagnosis: Stiffness of left knee, not elsewhere classified  Acute pain of left knee  Difficulty in walking, not elsewhere classified  Other abnormalities of gait and mobility     Problem List Patient Active Problem List   Diagnosis Date Noted  . Nicotine dependence 03/06/2017  . Bipolar II disorder (HCC)   . Closed bicondylar fracture of left tibial plateau 03/04/2017  . Avascular necrosis of bone of hip (HCC) 01/14/2017  .  Chronic hip pain 01/14/2017  . ADD (attention deficit disorder) 11/10/2016  . 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 Personal history of hyperthyroidism 07/16/2011  . Knee pain 05/06/2011    Marry Guan, PT, MPT 06/30/2017, 1:14 PM  [facility] [address]  [address] [place], [place], [address] Phone: [phone]   Fax:  [phone]  Name: TIANE SZYDLOWSKI MRN: [id number] Date of Birth: 07-Nov-1975

## 2017-07-05 ENCOUNTER — Ambulatory Visit: Payer: 59

## 2017-07-08 ENCOUNTER — Ambulatory Visit: Payer: 59

## 2017-07-08 DIAGNOSIS — R262 Difficulty in walking, not elsewhere classified: Secondary | ICD-10-CM

## 2017-07-08 DIAGNOSIS — M25662 Stiffness of left knee, not elsewhere classified: Secondary | ICD-10-CM | POA: Diagnosis not present

## 2017-07-08 DIAGNOSIS — M25562 Pain in left knee: Secondary | ICD-10-CM

## 2017-07-08 DIAGNOSIS — R2689 Other abnormalities of gait and mobility: Secondary | ICD-10-CM

## 2017-07-08 NOTE — Therapy (Addendum)
[facility] [address]  [place], [place], [address] Phone: [phone]   Fax:  [phone]  Physical Therapy Treatment  Patient Details  Name: QUANESHA KLIMASZEWSKI MRN: [id number] Date of Birth: [birth year] Referring Provider: Altamese Wharton, MD   Encounter Date: 07/08/2017  PT End of Session - 07/08/17 0805    Visit Number  23    Number of Visits  27    Date for PT Re-Evaluation  08/06/17    Authorization Type  [employer] (benefit period July - June)    Authorization - Number of Visits  27 VL 30 - 3 visits used with [facility] PT    PT Start Time  0802    PT Stop Time  0844    PT Time Calculation (min)  42 min    Activity Tolerance  Patient tolerated treatment well    Behavior During Therapy  [facility] for tasks assessed/performed       Past Medical History:  Diagnosis Date  . ADHD (attention deficit hyperactivity disorder)   . Avascular necrosis of bone of hip (HCC)    RIGHT GRAFT  . Bipolar II disorder ([place])   . Depression   . Nicotine dependence 03/06/2017  . PONV (postoperative nausea and vomiting)   . Wears glasses     Past Surgical History:  Procedure Laterality Date  . APPENDECTOMY  2000  . BREAST SURGERY  06/2009   RIGHT BREAST BIOPSY-NEGATIVE  . DILATION AND CURETTAGE OF UTERUS    . FASCIOTOMY Left 03/04/2017   Procedure: ANTERIOR COMPARTMENT FASCIOTOMY;  Surgeon: Altamese Hazleton, MD;  Location: [place];  Service: Orthopedics;  Laterality: Left;  . HIP ARTHROSCOPY  2008, 2010   AVASCULAR NECROSIS--RIGHT HIP 2010  . KNEE SURGERY  1995   ACL  . ORIF TIBIA PLATEAU Left 03/04/2017   Procedure: OPEN REDUCTION INTERNAL FIXATION (ORIF) TIBIAL PLATEAU;  Surgeon: Altamese Elkhorn, MD;  Location: [place];  Service: Orthopedics;  Laterality: Left;  . SHOULDER SURGERY  [phone]  . TONSILLECTOMY    . WISDOM TOOTH EXTRACTION      There were no vitals filed for this visit.  Subjective Assessment - 07/08/17 0805    Subjective   Pt. reporting good improvement in ankle status today and feels ankle has not bothered her since last visit.  Trying to use reciprocal step pattern on stairs.      Pertinent History  03/04/17 - L bicondylar tibial plateau fracture s/p ORIF, L lateral meniscus tear s/p repair & L anterior compartment fasciotomy; 1995 - L ACL tear & tibial plateau fracture s/p surgical repair & MUA; R tibial plateau fracture; 2010 - R THA d/t avascular necrosis    Patient Stated Goals  "get it to bend"    Currently in Pain?  No/denies    Pain Score  0-No pain    Multiple Pain Sites  No                      [facility] Adult PT Treatment/Exercise - 07/08/17 0807      Ambulation/Gait   Ambulation Distance (Feet)  400 Feet    Assistive device  None    Gait Pattern  Step-through pattern;Decreased weight shift to left;Decreased stance time - left;Decreased step length - right;Antalgic L LE mildly internally rotated and with foot supinated     Gait Comments  Pt. able to correct gait pattern with cueing for even wt. shift and B heel strike well today;  only reverting back to previous gait pattern when distracted      Knee/Hip Exercises: Aerobic   Elliptical  Lvl 1.0, 2 min - upon pt. request     Recumbent Bike  L2 x 6'      Knee/Hip Exercises: Machines for Strengthening   Cybex Knee Extension  B LE's 10# x 15 reps     Cybex Knee Flexion  B LE's 20# x 15 reps; B con/L ecc 20# x 10 reps     Cybex Leg Press  B LE's 35# x 15 reps; L LE only 20# x 10 reps       Knee/Hip Exercises: Standing   Functional Squat  3 seconds;15 reps;1 set    Functional Squat Limitations  TRX; mirror feedback for even wt. shift    Wall Squat  3 seconds;1 set;15 reps adduction ball squeeze     Wall Squat Limitations  cues for even wt. shift     SLS with Vectors  L SLS with cone opposite LE toe touch 2 x 30 sec; on foam oval              PT Education - 07/08/17 0847    Education provided  Yes    Education Details  step-down  6"     Person(s) Educated  Patient    Methods  Explanation;Demonstration;Verbal cues;Handout    Comprehension  Verbalized understanding;Returned demonstration;Verbal cues required;Need further instruction       PT Short Term Goals - 05/17/17 0807      PT SHORT TERM GOAL #1   Title  Independent with initial HEP    Status  Achieved      PT SHORT TERM GOAL #2   Title  L knee AROM 0-80 dg     Status  Achieved        PT Long Term Goals - 03/23/17 0818      PT LONG TERM GOAL #1   Title  Independent with ongoing HEP    Status  On-going      PT LONG TERM GOAL #2   Title  L knee AROM 0-120     Status  On-going      PT LONG TERM GOAL #3   Title  L hip and knee strength >/= 4/5    Status  On-going      PT LONG TERM GOAL #4   Title  Pt will ambulate with normal gait pattern w/o AD on level surfaces    Status  On-going      PT LONG TERM GOAL #5   Title  Pt will ascend/descend a flight of stairs with good reciprocal pattern to allow increased ease of access to 2nd floor apt    Status  On-going            Plan - 07/08/17 0806    Clinical Impression Statement  Donia Guiles doing well today reporting L ankle no longer bothering her.  Tolerated all LE strengthening activities well today focused on improving comfort/control descending stairs and SLS activities.  Still requiring some cueing for even wt. shift with therex activities today however able to correct with cueing and mirror feedback.  Pt. progressing well with HEP updated with 6" step-down activity added to HEP today for improved control with stairs navigation.       Clinical Impairments Affecting Rehab Potential  h/o L ACL tear & tibial plateau fracture s/p surgical repair & MUA; R tibial plateau fracture; 2010 - R THA d/t avascular necrosis  PT Treatment/Interventions  Patient/family education;Manual techniques;Passive range of motion;Scar mobilization;Dry needling;Taping;Therapeutic exercise;Neuromuscular  re-education;Therapeutic activities;Functional mobility training;Gait training;Stair training;Moist Heat;Electrical Stimulation;Cryotherapy;Vasopneumatic Device;Ultrasound;Iontophoresis 64m/ml Dexamethasone;ADLs/Self Care Home Management    Consulted and Agree with Plan of Care  Patient       Patient will benefit from skilled therapeutic intervention in order to improve the following deficits and impairments:  Pain, Decreased range of motion, Impaired flexibility, Increased muscle spasms, Increased fascial restricitons, Decreased strength, Abnormal gait, Difficulty walking, Decreased activity tolerance, Decreased balance, Decreased scar mobility  Visit Diagnosis: Stiffness of left knee, not elsewhere classified  Acute pain of left knee  Difficulty in walking, not elsewhere classified  Other abnormalities of gait and mobility     Problem List Patient Active Problem List   Diagnosis Date Noted  . Nicotine dependence 03/06/2017  . Bipolar II disorder ([place]   . Closed bicondylar fracture of left tibial plateau 03/04/2017  . Av[place] of [place] of [place] ([place] 01/14/2017  . Chronic hip pain 01/14/2017  . ADD (attention deficit disorder) 11/10/2016  . 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 Personal history of hyperthyroidism 07/16/2011  . Knee pain 05/06/2011    MBess Harvest PTA 07/08/17 9:25 AM  [facility] [address] [place] [address]Phone: [phone]  Fax:  [phone] Name: MCORRINNA KARAPETYANMRN: [id number] of Birth: [birth year] PHYSICAL THERAPY DISCHARGE SUMMARY  Visits from Start of Care: 23  Current functional level related to goals / functional outcomes:   Pt with 4 visits remaining in POC but failed to return for any further appts in the 30 days following her last visit on 07/08/17. Refer to above note for status as of final visit.   Remaining deficits:   Unable to  formally assess due to failure to return.   Education / Equipment:   HEP  Plan: Patient agrees to discharge.  Patient goals were partially met. Patient is being discharged due to not returning since the last visit.  ?????    JPercival Spanish PT, MPT 08/12/17, 3:03 PM  [facility] [place] [address]Phone: [phone]  Fax:  [phone]

## 2017-07-12 ENCOUNTER — Ambulatory Visit: Payer: 59 | Admitting: Physical Therapy

## 2017-07-26 ENCOUNTER — Ambulatory Visit (INDEPENDENT_AMBULATORY_CARE_PROVIDER_SITE_OTHER): Payer: 59 | Admitting: Psychiatry

## 2017-07-26 ENCOUNTER — Encounter (HOSPITAL_COMMUNITY): Payer: Self-pay | Admitting: Psychiatry

## 2017-07-26 DIAGNOSIS — Z79899 Other long term (current) drug therapy: Secondary | ICD-10-CM | POA: Diagnosis not present

## 2017-07-26 DIAGNOSIS — F3181 Bipolar II disorder: Secondary | ICD-10-CM | POA: Diagnosis not present

## 2017-07-26 DIAGNOSIS — F988 Other specified behavioral and emotional disorders with onset usually occurring in childhood and adolescence: Secondary | ICD-10-CM

## 2017-07-26 DIAGNOSIS — F17210 Nicotine dependence, cigarettes, uncomplicated: Secondary | ICD-10-CM

## 2017-07-26 DIAGNOSIS — G4710 Hypersomnia, unspecified: Secondary | ICD-10-CM

## 2017-07-26 DIAGNOSIS — Z6379 Other stressful life events affecting family and household: Secondary | ICD-10-CM | POA: Diagnosis not present

## 2017-07-26 MED ORDER — METHYLPHENIDATE HCL 10 MG PO TABS: 10.0000 mg | ORAL_TABLET | Freq: Two times a day (BID) | ORAL | 0 refills | Status: DC

## 2017-07-26 MED ORDER — DEXMETHYLPHENIDATE HCL ER 40 MG PO CP24: 40.0000 mg | ORAL_CAPSULE | Freq: Every day | ORAL | 0 refills | Status: DC

## 2017-07-26 MED ORDER — ARIPIPRAZOLE 10 MG PO TABS: 10.0000 mg | ORAL_TABLET | Freq: Every day | ORAL | 1 refills | Status: AC

## 2017-07-26 MED ORDER — LAMOTRIGINE 200 MG PO TABS: 200.0000 mg | ORAL_TABLET | Freq: Every day | ORAL | 1 refills | Status: AC

## 2017-07-26 MED ORDER — ARMODAFINIL 250 MG PO TABS: 250.0000 mg | ORAL_TABLET | Freq: Every day | ORAL | 0 refills | Status: DC

## 2017-07-26 NOTE — Progress Notes (Signed)
BH MD/PA/NP OP Progress Note  07/26/2017 4:01 PM Alice [name]  MRN:  [id number]  Chief Complaint: Med check HPI: Alice [name] reports that she found out today that her mom has stage IV metastatic cancer.  This has been quite a surprise and she is grieving.  She feels like she is handling things well though, and anticipates the next few months may be challenging [facility] and juggling her new job promotion.  I spent time with her hearing about some of the areas of success with her recent job promotion, ongoing improvements in her relationship with her boyfriend.  She overall feels like her mood is stable, attention is stable.  We discussed an increase in Abilify to 15 mg if needed.  She would like to switch from Provigil to MakenaNuvigil which she had been on in the past, as she felt that that did a better job at helping with her wakefulness.  No acute suicidality or safety issues.  She agrees to follow-up in 10-12 weeks or sooner if needed.  Visit Diagnosis:    ICD-10-CM   1. Hypersomnia G47.10   2. Bipolar 2 disorder (HCC) F31.81 ARIPiprazole (ABILIFY) 10 MG tablet  3. Bipolar II disorder (HCC) F31.81 lamoTRIgine (LAMICTAL) 200 MG tablet  4. Attention deficit disorder, unspecified hyperactivity presence F98.8 methylphenidate (RITALIN) 10 MG tablet    methylphenidate (RITALIN) 10 MG tablet    methylphenidate (RITALIN) 10 MG tablet    Dexmethylphenidate HCl (FOCALIN XR) 40 MG CP24    Dexmethylphenidate HCl (FOCALIN XR) 40 MG CP24    Dexmethylphenidate HCl (FOCALIN XR) 40 MG CP24    Armodafinil (NUVIGIL) 250 MG tablet    Past Psychiatric History: See intake H&P for full details. Reviewed, with no updates at this time.  Past Medical History:  Past Medical History:  Diagnosis Date  . ADHD (attention deficit hyperactivity disorder)   . Avascular necrosis of bone of hip (HCC)    RIGHT GRAFT  . Bipolar II disorder (HCC)   . Depression   . Nicotine dependence  03/06/2017  . PONV (postoperative nausea and vomiting)   . Wears glasses     Past Surgical History:  Procedure Laterality Date  . APPENDECTOMY  2000  . BREAST SURGERY  06/2009   RIGHT BREAST BIOPSY-NEGATIVE  . DILATION AND CURETTAGE OF UTERUS    . FASCIOTOMY Left 03/04/2017   Procedure: ANTERIOR COMPARTMENT FASCIOTOMY;  Surgeon: Myrene GalasHandy, Michael, MD;  [facility];  Service: Orthopedics;  Laterality: Left;  . HIP ARTHROSCOPY  2008, 2010   AVASCULAR NECROSIS--RIGHT HIP 2010  . KNEE SURGERY  1995   ACL  . ORIF TIBIA PLATEAU Left 03/04/2017   Procedure: OPEN REDUCTION INTERNAL FIXATION (ORIF) TIBIAL PLATEAU;  Surgeon: Myrene GalasHandy, Michael, MD;  Location: [facility] OR;  Service: Orthopedics;  Laterality: Left;  . SHOULDER SURGERY  [phone],[id number]  . TONSILLECTOMY    . WISDOM TOOTH EXTRACTION      Family Psychiatric History: See intake H&P for full details. Reviewed, with no updates at this time.   Family History:  Family History  Problem Relation Age of Onset  . Breast cancer Mother 2064       BRACA NEGATIVE  . Hypertension Father   . Heart disease Father   . Cancer Father        PROSTATE    Social History:  Social History   Socioeconomic History  . Marital status: Single    Spouse name: Not on file  .  Number of children: Not on file  . Years of education: Not on file  . Highest education level: Not on file  Social Needs  . Financial resource strain: Not on file  . Food insecurity - worry: Not on file  . Food insecurity - inability: Not on file  . Transportation needs - medical: Not on file  . Transportation needs - non-medical: Not on file  Occupational History  . Not on file  Tobacco Use  . Smoking status: Current Some Day Smoker    Types: Cigarettes  . Smokeless tobacco: Never Used  Substance and Sexual Activity  . Alcohol use: Yes    Comment: social  . Drug use: No  . Sexual activity: Yes    Partners: Male    Birth control/protection: Pill  Other Topics Concern  . Not  on file  Social History Narrative  . Not on file    Allergies:  Allergies  Allergen Reactions  . Methotrexate Derivatives     UNSPECIFIED REACTION   . Chantix [Varenicline Tartrate] Rash    Metabolic Disorder Labs: Lab Results  Component Value Date   HGBA1C 5.0 03/04/2017   MPG 96.8 03/04/2017   MPG 108 11/26/2010   No results found for: PROLACTIN Lab Results  Component Value Date   CHOL 166 11/26/2010   TRIG 89 11/26/2010   HDL 50 11/26/2010   CHOLHDL 3.3 11/26/2010   VLDL 18 11/26/2010   LDLCALC 98 11/26/2010   Lab Results  Component Value Date   TSH 2.039 03/04/2017   TSH 1.135 11/26/2010    Therapeutic Level Labs: No results found for: LITHIUM No results found for: VALPROATE No components found for:  CBMZ  Current Medications: Current Outpatient Medications  Medication Sig Dispense Refill  . ARIPiprazole (ABILIFY) 10 MG tablet Take 1 tablet (10 mg total) by mouth daily. 90 tablet 1  . Armodafinil (NUVIGIL) 250 MG tablet Take 1 tablet (250 mg total) by mouth daily. 90 tablet 0  . [START ON 09/24/2017] Dexmethylphenidate HCl (FOCALIN XR) 40 MG CP24 Take 1 capsule (40 mg total) by mouth daily. 30 capsule 0  . [START ON 08/25/2017] Dexmethylphenidate HCl (FOCALIN XR) 40 MG CP24 Take 1 capsule (40 mg total) by mouth daily. 30 capsule 0  . Dexmethylphenidate HCl (FOCALIN XR) 40 MG CP24 Take 1 capsule (40 mg total) by mouth daily. 30 capsule 0  . lamoTRIgine (LAMICTAL) 200 MG tablet Take 1 tablet (200 mg total) by mouth daily. 90 tablet 1  . [START ON 09/24/2017] methylphenidate (RITALIN) 10 MG tablet Take 1 tablet (10 mg total) by mouth 2 (two) times daily with breakfast and lunch. 60 tablet 0  . [START ON 08/25/2017] methylphenidate (RITALIN) 10 MG tablet Take 1 tablet (10 mg total) by mouth 2 (two) times daily with breakfast and lunch. 60 tablet 0  . methylphenidate (RITALIN) 10 MG tablet Take 1 tablet (10 mg total) by mouth 2 (two) times daily with breakfast and  lunch. 60 tablet 0  . modafinil (PROVIGIL) 200 MG tablet Take 1 tablet (200 mg total) daily by mouth. 30 tablet 2  . sildenafil (VIAGRA) 50 MG tablet Take 1 tablet (50 mg total) daily as needed by mouth (take 1 hour before sexual activity). 30 tablet 0   No current facility-administered medications for this visit.      Musculoskeletal: Strength & Muscle Tone: within normal limits Gait & Station: normal Patient leans: N/A  Psychiatric Specialty Exam: ROS  There were no vitals taken for this  visit.There is no height or weight on file to calculate BMI.  General Appearance: Casual and Fairly Groomed  Eye Contact:  Fair  Speech:  Clear and Coherent and Normal Rate  Volume:  Normal  Mood:  Dysphoric and Euthymic  Affect:  Appropriate and Congruent  Thought Process:  Coherent, Goal Directed and Descriptions of Associations: Intact  Orientation:  Full (Time, Place, and Person)  Thought Content: Logical   Suicidal Thoughts:  No  Homicidal Thoughts:  No  Memory:  Immediate;   Good  Judgement:  Good  Insight:  Fair  Psychomotor Activity:  Normal  Concentration:  Concentration: Good  Recall:  Good  Fund of Knowledge: Good  Language: Good  Akathisia:  Negative  Handed:  Right  AIMS (if indicated): not done  Assets:  Communication Skills Desire for Improvement Financial Resources/Insurance Housing Intimacy Leisure Time Physical Health Resilience Social Support Talents/Skills Transportation Vocational/Educational  ADL's:  Intact  Cognition: WNL  Sleep:  Good   Screenings: GAD-7     [facility]12/2016 [facility]  Total GAD-7 Score  6    PHQ2-9     Office Visit from 12/11/2016 in [facility] Office Visit from 10/12/2016 in [facility]  PHQ-2 Total Score  0  0       Assessment and Plan:  Alice [name] presents today for med management follow-up.  Mood, energy, attention, and  hypersomnolence are generally stable on the medication regimen below.  No acute safety issues.  She shares difficult news that her mother has been diagnosed with stage IV cancer, and prognosis is likely poor.  She appears to be handling things fairly well, and has an excellent social support system.  Mood has remained stable, so we will follow up in 10-12 weeks, continue medication management as below.  1. Hypersomnia   2. Bipolar 2 disorder (HCC)   3. Bipolar II disorder (HCC)   4. Attention deficit disorder, unspecified hyperactivity presence     Status of current problems: stable  Labs Ordered: No orders of the defined types were placed in this encounter.   Labs Reviewed: n/a  Collateral Obtained/Records Reviewed: n/a  Plan:  Switch from Provigil to Nuvigil with the goal to increase wakefulness; Nuvigil 250 mg daily Continue Focalin XR 40 mg daily Continue Ritalin 10 mg twice a day Continue Lamictal 200 mg and Abilify 10 mg for bipolar disorder rtc 10 weeks  I spent 20 minutes with the patient in direct face-to-face clinical care.  Greater than 50% of this time was spent in counseling and coordination of care with the patient.    Burnard Leigh, MD 07/26/2017, 4:01 PM

## 2017-09-13 ENCOUNTER — Telehealth (HOSPITAL_COMMUNITY): Payer: Self-pay

## 2017-09-13 NOTE — Telephone Encounter (Signed)
Patient is calling to let you know that she is not getting the same effect from the Nuvigil that she got from Provigil. She said it causes dizziness and a lack of focus. She would like to know if she can go back on the Provigil. Please review and advise, thank you

## 2017-09-14 ENCOUNTER — Other Ambulatory Visit (HOSPITAL_COMMUNITY): Payer: Self-pay

## 2017-09-14 ENCOUNTER — Other Ambulatory Visit (HOSPITAL_COMMUNITY): Payer: Self-pay | Admitting: Psychiatry

## 2017-09-14 DIAGNOSIS — F988 Other specified behavioral and emotional disorders with onset usually occurring in childhood and adolescence: Secondary | ICD-10-CM

## 2017-09-14 MED ORDER — MODAFINIL 200 MG PO TABS: 200.0000 mg | ORAL_TABLET | Freq: Every day | ORAL | 0 refills | Status: DC

## 2017-09-14 NOTE — Telephone Encounter (Signed)
Yes that is fine, I sent in the refill to her [facility]

## 2017-10-11 ENCOUNTER — Encounter (HOSPITAL_COMMUNITY): Payer: Self-pay | Admitting: Psychiatry

## 2017-10-11 ENCOUNTER — Ambulatory Visit (HOSPITAL_COMMUNITY): Payer: 59 | Admitting: Psychiatry

## 2017-10-11 VITALS — BP 120/70 | HR 80 | Ht 63.25 in | Wt 119.2 lb

## 2017-10-11 DIAGNOSIS — F988 Other specified behavioral and emotional disorders with onset usually occurring in childhood and adolescence: Secondary | ICD-10-CM

## 2017-10-11 DIAGNOSIS — F4312 Post-traumatic stress disorder, chronic: Secondary | ICD-10-CM | POA: Diagnosis not present

## 2017-10-11 DIAGNOSIS — G4710 Hypersomnia, unspecified: Secondary | ICD-10-CM

## 2017-10-11 DIAGNOSIS — Z79899 Other long term (current) drug therapy: Secondary | ICD-10-CM | POA: Diagnosis not present

## 2017-10-11 DIAGNOSIS — F17210 Nicotine dependence, cigarettes, uncomplicated: Secondary | ICD-10-CM | POA: Diagnosis not present

## 2017-10-11 DIAGNOSIS — F3181 Bipolar II disorder: Secondary | ICD-10-CM | POA: Diagnosis not present

## 2017-10-11 DIAGNOSIS — Z634 Disappearance and death of family member: Secondary | ICD-10-CM

## 2017-10-11 MED ORDER — AMPHETAMINE-DEXTROAMPHETAMINE 10 MG PO TABS: 10.0000 mg | ORAL_TABLET | Freq: Two times a day (BID) | ORAL | 0 refills | Status: DC

## 2017-10-11 MED ORDER — DEXMETHYLPHENIDATE HCL ER 40 MG PO CP24: 40.0000 mg | ORAL_CAPSULE | Freq: Every day | ORAL | 0 refills | Status: DC

## 2017-10-11 MED ORDER — MODAFINIL 200 MG PO TABS: 200.0000 mg | ORAL_TABLET | Freq: Every day | ORAL | 0 refills | Status: AC

## 2017-10-11 NOTE — Progress Notes (Signed)
BH MD/PA/NP OP Progress Note  10/11/2017 3:03 PM Alice [name]  MRN:  [id number]  Chief Complaint: mom passed away  HPI: Alice [name] has had multiple stressors recently including mom passing away, which was somewhat expected due to stage IV cancer.  She was able to visit her in hospice, and feels grief, but also at peace with mom given that she is no longer suffering with pain.  She reports that she also has had a recent job change, as her company was bought out and moving to the [employer].  She has taken a different job locally which she was able to find fairly quickly.  Reports that this has been a generally calm and positive change.  Reports that things are going well with her and her boyfriend they will of been dating 1 year next week.  Denies any acute safety issues or significant mood lability.  Feels like her bipolar disorder symptoms are under good control.  We discussed her current and ongoing issues regarding and attention and focus difficulties, difficulties with task organization, difficulties with procrastination.  We agreed to continue the current dose of Focalin extended release and augment with dextroamphetamine immediate release instead of methylphenidate immediate release.  She continues on Provigil, Abilify, Lamictal without any significant side effects.  I reiterated and educated her on the long-term ramifications of Abilify including possible development of tardive dyskinesia.  She was appreciative for the education, we agreed to continue the current regimen and follow-up in 3 months.  Disclosed to patient that this Clinical research associate is leaving this practice at the end of August 2019, and patients always has the right to choose their provider. Reassured patient that office will work to provide smooth transition of care whether they wish to remain at this office, or to continue with this provider, or seek alternative care options in community.  They expressed  understanding.   Visit Diagnosis:    ICD-10-CM   1. Bipolar 2 disorder (HCC) F31.81   2. Attention deficit disorder, unspecified hyperactivity presence F98.8 Dexmethylphenidate HCl (FOCALIN XR) 40 MG CP24    Dexmethylphenidate HCl (FOCALIN XR) 40 MG CP24    Dexmethylphenidate HCl (FOCALIN XR) 40 MG CP24    modafinil (PROVIGIL) 200 MG tablet    amphetamine-dextroamphetamine (ADDERALL) 10 MG tablet    amphetamine-dextroamphetamine (ADDERALL) 10 MG tablet    amphetamine-dextroamphetamine (ADDERALL) 10 MG tablet  3. Hypersomnia G47.10   4. Chronic post-traumatic stress disorder (PTSD) F43.12     Past Psychiatric History: See intake H&P for full details. Reviewed, with no updates at this time.   Past Medical History:  Past Medical History:  Diagnosis Date  . ADHD (attention deficit hyperactivity disorder)   . Avascular necrosis of bone of hip (HCC)    RIGHT GRAFT  . Bipolar II disorder (HCC)   . Depression   . Nicotine dependence 03/06/2017  . PONV (postoperative nausea and vomiting)   . Wears glasses     Past Surgical History:  Procedure Laterality Date  . APPENDECTOMY  2000  . BREAST SURGERY  06/2009   RIGHT BREAST BIOPSY-NEGATIVE  . DILATION AND CURETTAGE OF UTERUS    . FASCIOTOMY Left 03/04/2017   Procedure: ANTERIOR COMPARTMENT FASCIOTOMY;  Surgeon: Myrene Galas, MD;  Location[facility] OR;  Service: Orthopedics;  Laterality: Left;  . HIP ARTHROSCOPY  2008, 2010   AVASCULAR NECROSIS--RIGHT HIP 2010  . KNEE SURGERY  1995   ACL  . ORIF TIBIA PLATEAU Left 03/04/2017   Procedure: OPEN REDUCTION  INTERNAL FIXATION (ORIF) TIBIAL PLATEAU;  Surgeon: Myrene Galas, MD;  Location: [facility] OR;  Service: Orthopedics;  Laterality: Left;  . SHOULDER SURGERY  [phone]  . TONSILLECTOMY    . WISDOM TOOTH EXTRACTION      Family Psychiatric History: See intake H&P for full details. Reviewed, with no updates at this time.   Family History:  Family History  Problem Relation Age of Onset   . Breast cancer Mother 59       BRACA NEGATIVE  . Hypertension Father   . Heart disease Father   . Cancer Father        PROSTATE    Social History:  Social History   Socioeconomic History  . Marital status: Single    Spouse name: Not on file  . Number of children: Not on file  . Years of education: Not on file  . Highest education level: Not on file  Occupational History  . Not on file  Social Needs  . Financial resource strain: Not on file  . Food insecurity:    Worry: Not on file    Inability: Not on file  . Transportation needs:    Medical: Not on file    Non-medical: Not on file  Tobacco Use  . Smoking status: Current Some Day Smoker    Types: Cigarettes  . Smokeless tobacco: Never Used  Substance and Sexual Activity  . Alcohol use: Yes    Comment: social  . Drug use: No  . Sexual activity: Yes    Partners: Male    Birth control/protection: Pill  Lifestyle  . Physical activity:    Days per week: Not on file    Minutes per session: Not on file  . Stress: Not on file  Relationships  . Social connections:    Talks on phone: Not on file    Gets together: Not on file    Attends religious service: Not on file    Active member of club or organization: Not on file    Attends meetings of clubs or organizations: Not on file    Relationship status: Not on file  Other Topics Concern  . Not on file  Social History Narrative  . Not on file    Allergies:  Allergies  Allergen Reactions  . Methotrexate Derivatives     UNSPECIFIED REACTION   . Chantix [Varenicline Tartrate] Rash    Metabolic Disorder Labs: Lab Results  Component Value Date   HGBA1C 5.0 03/04/2017   MPG 96.8 03/04/2017   MPG 108 11/26/2010   No results found for: PROLACTIN Lab Results  Component Value Date   CHOL 166 11/26/2010   TRIG 89 11/26/2010   HDL 50 11/26/2010   CHOLHDL 3.3 11/26/2010   VLDL 18 11/26/2010   LDLCALC 98 11/26/2010   Lab Results  Component Value Date   TSH  2.039 03/04/2017   TSH 1.135 11/26/2010    Therapeutic Level Labs: No results found for: LITHIUM No results found for: VALPROATE No components found for:  CBMZ  Current Medications: Current Outpatient Medications  Medication Sig Dispense Refill  . ARIPiprazole (ABILIFY) 10 MG tablet Take 1 tablet (10 mg total) by mouth daily. 90 tablet 1  . [START ON 12/08/2017] Dexmethylphenidate HCl (FOCALIN XR) 40 MG CP24 Take 1 capsule (40 mg total) by mouth daily. 30 capsule 0  . [START ON 11/09/2017] Dexmethylphenidate HCl (FOCALIN XR) 40 MG CP24 Take 1 capsule (40 mg total) by mouth daily. 30 capsule 0  . Dexmethylphenidate  HCl (FOCALIN XR) 40 MG CP24 Take 1 capsule (40 mg total) by mouth daily. 30 capsule 0  . lamoTRIgine (LAMICTAL) 200 MG tablet Take 1 tablet (200 mg total) by mouth daily. 90 tablet 1  . [START ON 12/08/2017] modafinil (PROVIGIL) 200 MG tablet Take 1 tablet (200 mg total) by mouth daily. 90 tablet 0  . sildenafil (VIAGRA) 50 MG tablet Take 1 tablet (50 mg total) daily as needed by mouth (take 1 hour before sexual activity). 30 tablet 0  . amphetamine-dextroamphetamine (ADDERALL) 10 MG tablet Take 1 tablet (10 mg total) by mouth 2 (two) times daily with a meal. 60 tablet 0  . [START ON 11/09/2017] amphetamine-dextroamphetamine (ADDERALL) 10 MG tablet Take 1 tablet (10 mg total) by mouth 2 (two) times daily with a meal. 60 tablet 0  . [START ON 12/08/2017] amphetamine-dextroamphetamine (ADDERALL) 10 MG tablet Take 1 tablet (10 mg total) by mouth 2 (two) times daily with a meal. 60 tablet 0   No current facility-administered medications for this visit.      Musculoskeletal: Strength & Muscle Tone: within normal limits Gait & Station: normal Patient leans: N/A  Psychiatric Specialty Exam: ROS  Blood pressure 120/70, pulse 80, height 5' 3.25" (1.607 m), weight 119 lb 3.2 oz (54.1 kg).Body mass index is 20.95 kg/m.  General Appearance: Casual and Fairly Groomed  Eye Contact:  Good   Speech:  Clear and Coherent and Normal Rate  Volume:  Normal  Mood:  Euthymic and grief  Affect:  Appropriate and Congruent  Thought Process:  Goal Directed and Descriptions of Associations: Intact  Orientation:  Full (Time, Place, and Person)  Thought Content: Logical   Suicidal Thoughts:  No  Homicidal Thoughts:  No  Memory:  Immediate;   Fair  Judgement:  Good  Insight:  Good  Psychomotor Activity:  Normal  Concentration:  Concentration: Good  Recall:  Good  Fund of Knowledge: Good  Language: Good  Akathisia:  Negative  Handed:  Right  AIMS (if indicated): not done  Assets:  Communication Skills Desire for Improvement Financial Resources/Insurance Housing Intimacy Physical Health Resilience Social Support Talents/Skills Transportation Vocational/Educational  ADL's:  Intact  Cognition: WNL  Sleep:  Good   Screenings: GAD-7     Office Visit [facility] 10[facility]12/[facility]  Total GAD-7 Score  6    PHQ2-9     Office Visit from 12/11/2016 in [facility] Office Visit from 10/12/2016 in [facility]  PHQ-2 Total Score  0  0       Assessment and Plan:  Alice [name] has been incredibly resilient in the face of multiple changes including the death of her mom from stage IV cancer about 1 week ago, job changes which were fairly abrupt and required her to "think on her toes" and rapidly find a new position locally.  She is also celebrating 1 year with her boyfriend.  Some of the stressors have been positive stressors, and she overall feels like her mood has remained very stable.  She continues on Abilify and Lamictal for mood stabilization of bipolar disorder type II.  Her primary concern right now is related to ongoing fatigue, hypersomnia, and difficulty with attention.  We agreed to switch her stimulant therapies as below, and continue Provigil at 200 mg daily.  She denies any acute safety issues, and  agrees to follow-up in 2-3 months or sooner if needed.  1. Bipolar 2 disorder (HCC)  2. Attention deficit disorder, unspecified hyperactivity presence   3. Hypersomnia   4. Chronic post-traumatic stress disorder (PTSD)     Status of current problems: stable  Labs Ordered: No orders of the defined types were placed in this encounter.   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  Greater than 50% of this time was spent in counseling and coordination of care with the patient.    Burnard Leigh, MD 10/11/2017, 3:03 PM

## 2017-11-18 ENCOUNTER — Telehealth (HOSPITAL_COMMUNITY): Payer: Self-pay

## 2017-11-18 NOTE — Telephone Encounter (Signed)
Patient called and states that she is having increased anxiety and panic attacks. She is dealing with the loss of her mother and her cat is sick. She would like to know if you can prescribe Xanax as needed, it worked for her in the past. Please review and advise, thank you

## 2017-11-18 NOTE — Telephone Encounter (Signed)
Id like to help her with the anxiety, would be happy to approve ativan 0.5 mg tablets - we can send 15 tablets to use sparingly for anxiety that is not responsive to other methods of calming techniques

## 2017-11-19 MED ORDER — LORAZEPAM 0.5 MG PO TABS: ORAL_TABLET | ORAL | 0 refills | Status: DC

## 2017-11-19 NOTE — Telephone Encounter (Signed)
I called prescription into the pharmacy and let the patient know

## 2017-12-01 ENCOUNTER — Telehealth (HOSPITAL_COMMUNITY): Payer: Self-pay

## 2017-12-01 NOTE — Telephone Encounter (Signed)
Patient is calling to report that she believes the increase in her anxiety may be caused by the Adderall and would like to know if she can go back on Immediate release Ritilin. Please review and advise, thank you

## 2017-12-01 NOTE — Telephone Encounter (Signed)
Yes we can do that, does she have some left over to give this a try?  Then she can let us know if that has helped.

## 2017-12-03 NOTE — Telephone Encounter (Signed)
Patient stated that she is not sure if she has any, everything is in boxes right now. [facility] [address]

## 2017-12-06 ENCOUNTER — Other Ambulatory Visit (HOSPITAL_COMMUNITY): Payer: Self-pay | Admitting: Psychiatry

## 2017-12-06 DIAGNOSIS — F988 Other specified behavioral and emotional disorders with onset usually occurring in childhood and adolescence: Secondary | ICD-10-CM

## 2017-12-06 MED ORDER — METHYLPHENIDATE HCL 10 MG PO TABS: 10.0000 mg | ORAL_TABLET | Freq: Two times a day (BID) | ORAL | 0 refills | Status: DC

## 2017-12-06 NOTE — Telephone Encounter (Signed)
I will send 30 days and expect to see her on 7/15 as scheduled so we can discuss

## 2017-12-09 ENCOUNTER — Ambulatory Visit (HOSPITAL_COMMUNITY): Payer: Self-pay | Admitting: Psychiatry

## 2017-12-27 ENCOUNTER — Ambulatory Visit (HOSPITAL_COMMUNITY): Payer: 59 | Admitting: Psychiatry

## 2017-12-27 ENCOUNTER — Encounter (HOSPITAL_COMMUNITY): Payer: Self-pay | Admitting: Psychiatry

## 2017-12-27 VITALS — BP 110/64 | HR 88 | Ht 63.25 in | Wt 118.0 lb

## 2017-12-27 DIAGNOSIS — F3181 Bipolar II disorder: Secondary | ICD-10-CM

## 2017-12-27 DIAGNOSIS — F17210 Nicotine dependence, cigarettes, uncomplicated: Secondary | ICD-10-CM | POA: Diagnosis not present

## 2017-12-27 DIAGNOSIS — F988 Other specified behavioral and emotional disorders with onset usually occurring in childhood and adolescence: Secondary | ICD-10-CM

## 2017-12-27 DIAGNOSIS — F4312 Post-traumatic stress disorder, chronic: Secondary | ICD-10-CM

## 2017-12-27 MED ORDER — LORAZEPAM 0.5 MG PO TABS: ORAL_TABLET | ORAL | 0 refills | Status: DC

## 2017-12-27 MED ORDER — BUSPIRONE HCL 10 MG PO TABS: 10.0000 mg | ORAL_TABLET | Freq: Three times a day (TID) | ORAL | 1 refills | Status: DC

## 2017-12-27 MED ORDER — METHYLPHENIDATE HCL 10 MG PO TABS: 10.0000 mg | ORAL_TABLET | Freq: Two times a day (BID) | ORAL | 0 refills | Status: DC

## 2017-12-27 MED ORDER — DEXMETHYLPHENIDATE HCL ER 40 MG PO CP24: 40.0000 mg | ORAL_CAPSULE | Freq: Every day | ORAL | 0 refills | Status: DC

## 2017-12-27 NOTE — Progress Notes (Signed)
BH MD/PA/NP OP Progress Note  12/27/2017 3:01 PM Alice [name]  MRN:  [id number]  Chief Complaint: anxiety  HPI: Alice [name] has had some anxiety and irritability, grief, coping with multiple bookkeeping issues related to her mother's passing, cleaning out home, issues of the estate.  Reports that she has received feedback from her boyfriend and her friends that she has been a little bit more irritable and worried.  She agrees and feels like she has been more anxious.  The Ativan has helped on the occasion that she has taken some, but she understands this is addictive and sedating.  We agreed to initiate BuSpar 10 mg 3 times a day as needed.  She was receptive to this, and I also discussed potentially initiating SSRI, but she and I both have apprehensions about this given her bipolar 2 diagnosis.  She has not engaged in any unsafe behaviors or substance abuse.  She would like to give this a try with the BuSpar and follow-up in a couple months.  She does feel like the switch from Adderall to Ritalin went well in terms of reducing her anxiety, and wishes to continue with the Focalin and Ritalin.  Visit Diagnosis:    ICD-10-CM   1. Attention deficit disorder, unspecified hyperactivity presence F98.8 busPIRone (BUSPAR) 10 MG tablet    Dexmethylphenidate HCl (FOCALIN XR) 40 MG CP24    Dexmethylphenidate HCl (FOCALIN XR) 40 MG CP24    Dexmethylphenidate HCl (FOCALIN XR) 40 MG CP24    methylphenidate (RITALIN) 10 MG tablet    methylphenidate (RITALIN) 10 MG tablet  2. Bipolar 2 disorder (HCC) F31.81 busPIRone (BUSPAR) 10 MG tablet    LORazepam (ATIVAN) 0.5 MG tablet  3. Chronic post-traumatic stress disorder (PTSD) F43.12   4. Bipolar II disorder (HCC) F31.81 busPIRone (BUSPAR) 10 MG tablet    Past Psychiatric History: See intake H&P for full details. Reviewed, with no updates at this time.   Past Medical History:  Past Medical History:  Diagnosis Date  . ADHD (attention deficit  hyperactivity disorder)   . Avascular necrosis of bone of hip (HCC)    RIGHT GRAFT  . Bipolar II disorder (HCC)   . Depression   . Nicotine dependence 03/06/2017  . PONV (postoperative nausea and vomiting)   . Wears glasses     Past Surgical History:  Procedure Laterality Date  . APPENDECTOMY  2000  . BREAST SURGERY  06/2009   RIGHT BREAST BIOPSY-NEGATIVE  . DILATION AND CURETTAGE OF UTERUS    . FASCIOTOMY Left 03/04/2017   Procedure: ANTERIOR COMPARTMENT FASCIOTOMY;  Surgeon: Myrene Galas, MD;  Location: [facility] OR;  Service: Orthopedics;  Laterality: Left;  . HIP ARTHROSCOPY  2008, 2010   AVASCULAR NECROSIS--RIGHT HIP 2010  . KNEE SURGERY  1995   ACL  . ORIF TIBIA PLATEAU Left 03/04/2017   Procedure: OPEN REDUCTION INTERNAL FIXATION (ORIF) TIBIAL PLATEAU;  Surgeon: Myrene Galas, MD;  Location: [facility] OR;  Service: Orthopedics;  Laterality: Left;  . SHOULDER SURGERY  [phone]  . TONSILLECTOMY    . WISDOM TOOTH EXTRACTION      Family Psychiatric History: See intake H&P for full details. Reviewed, with no updates at this time.   Family History:  Family History  Problem Relation Age of Onset  . Breast cancer Mother 64       BRACA NEGATIVE  . Hypertension Father   . Heart disease Father   . Cancer Father        PROSTATE  Social History:  Social History   Socioeconomic History  . Marital status: Single    Spouse name: Not on file  . Number of children: Not on file  . Years of education: Not on file  . Highest education level: Not on file  Occupational History  . Not on file  Social Needs  . Financial resource strain: Not on file  . Food insecurity:    Worry: Not on file    Inability: Not on file  . Transportation needs:    Medical: Not on file    Non-medical: Not on file  Tobacco Use  . Smoking status: Current Some Day Smoker    Types: Cigarettes  . Smokeless tobacco: Never Used  Substance and Sexual Activity  . Alcohol use: Yes    Comment: social  .  Drug use: No  . Sexual activity: Yes    Partners: Male    Birth control/protection: Pill  Lifestyle  . Physical activity:    Days per week: Not on file    Minutes per session: Not on file  . Stress: Not on file  Relationships  . Social connections:    Talks on phone: Not on file    Gets together: Not on file    Attends religious service: Not on file    Active member of club or organization: Not on file    Attends meetings of clubs or organizations: Not on file    Relationship status: Not on file  Other Topics Concern  . Not on file  Social History Narrative  . Not on file    Allergies:  Allergies  Allergen Reactions  . Methotrexate Derivatives     UNSPECIFIED REACTION   . Chantix [Varenicline Tartrate] Rash    Metabolic Disorder Labs: Lab Results  Component Value Date   HGBA1C 5.0 03/04/2017   MPG 96.8 03/04/2017   MPG 108 11/26/2010   No results found for: PROLACTIN Lab Results  Component Value Date   CHOL 166 11/26/2010   TRIG 89 11/26/2010   HDL 50 11/26/2010   CHOLHDL 3.3 11/26/2010   VLDL 18 11/26/2010   LDLCALC 98 11/26/2010   Lab Results  Component Value Date   TSH 2.039 03/04/2017   TSH 1.135 11/26/2010    Therapeutic Level Labs: No results found for: LITHIUM No results found for: VALPROATE No components found for:  CBMZ  Current Medications: Current Outpatient Medications  Medication Sig Dispense Refill  . ARIPiprazole (ABILIFY) 10 MG tablet Take 1 tablet (10 mg total) by mouth daily. 90 tablet 1  . [START ON 02/25/2018] Dexmethylphenidate HCl (FOCALIN XR) 40 MG CP24 Take 1 capsule (40 mg total) by mouth daily. 30 capsule 0  . [START ON 01/26/2018] Dexmethylphenidate HCl (FOCALIN XR) 40 MG CP24 Take 1 capsule (40 mg total) by mouth daily. 30 capsule 0  . Dexmethylphenidate HCl (FOCALIN XR) 40 MG CP24 Take 1 capsule (40 mg total) by mouth daily. 30 capsule 0  . lamoTRIgine (LAMICTAL) 200 MG tablet Take 1 tablet (200 mg total) by mouth daily. 90  tablet 1  . LORazepam (ATIVAN) 0.5 MG tablet Take one tablet as needed for severe anxiety, not to exceed one a day 15 tablet 0  . [START ON 01/03/2018] methylphenidate (RITALIN) 10 MG tablet Take 1 tablet (10 mg total) by mouth 2 (two) times daily with breakfast and lunch. 60 tablet 0  . modafinil (PROVIGIL) 200 MG tablet Take 1 tablet (200 mg total) by mouth daily. 90 tablet 0  .  sildenafil (VIAGRA) 50 MG tablet Take 1 tablet (50 mg total) daily as needed by mouth (take 1 hour before sexual activity). 30 tablet 0  . busPIRone (BUSPAR) 10 MG tablet Take 1 tablet (10 mg total) by mouth 3 (three) times daily. 90 tablet 1  . [START ON 02/02/2018] methylphenidate (RITALIN) 10 MG tablet Take 1 tablet (10 mg total) by mouth 2 (two) times daily with breakfast and lunch. 30 tablet 0   No current facility-administered medications for this visit.      Musculoskeletal: Strength & Muscle Tone: within normal limits Gait & Station: normal Patient leans: N/A  Psychiatric Specialty Exam: ROS  Blood pressure 110/64, pulse 88, height 5' 3.25" (1.607 m), weight 118 lb (53.5 kg).Body mass index is 20.74 kg/m.  General Appearance: Casual and Fairly Groomed  Eye Contact:  Good  Speech:  Clear and Coherent and Normal Rate  Volume:  Normal  Mood:  Euthymic and grief  Affect:  Appropriate and Congruent  Thought Process:  Goal Directed and Descriptions of Associations: Intact  Orientation:  Full (Time, Place, and Person)  Thought Content: Logical   Suicidal Thoughts:  No  Homicidal Thoughts:  No  Memory:  Immediate;   Fair  Judgement:  Good  Insight:  Good  Psychomotor Activity:  Normal  Concentration:  Concentration: Good  Recall:  Good  Fund of Knowledge: Good  Language: Good  Akathisia:  Negative  Handed:  Right  AIMS (if indicated): not done  Assets:  Communication Skills Desire for Improvement Financial Resources/Insurance Housing Intimacy Physical Health Resilience Social  Support Talents/Skills Transportation Vocational/Educational  ADL's:  Intact  Cognition: WNL  Sleep:  Good   Screenings: GAD-7     Office Visit from 10/12/2016 in [facility]  Total GAD-7 Score  6    PHQ2-9     Office Visit from 12/11/2016 in [facility] Office Visit from 10/12/2016 in [facility]  PHQ-2 Total Score  0  0       Assessment and Plan:  Alice [name] has faced some difficulty with increased anxiety and irritability in the face of her mom's recent passing.  She has continued to display resilience and coping with the issues of mom's estate, cleaning out the home, and dealing with some of the paperwork issues related to mom's passing.  She has had some understandable grief which has manifested with irritability and anxiety.  She is sleeping consistently every night, and does not present with any rapid racing thoughts, racing speech, or abnormal or risky behaviors.  I suggested we initiate a low-dose of BuSpar to help with generalized anxiety and irritability and she was receptive to this.  We agreed to follow-up in 8 weeks or sooner if needed.  No acute safety issues or substance abuse, and she continues to have a good support system.  She tends to be able to distract herself by keeping herself busy with work.  1. Attention deficit disorder, unspecified hyperactivity presence   2. Bipolar 2 disorder (HCC)   3. Chronic post-traumatic stress disorder (PTSD)   4. Bipolar II disorder (HCC)     Status of current problems: stable  Labs Ordered: No orders of the defined types were placed in this encounter.   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

## 2018-02-05 ENCOUNTER — Telehealth (HOSPITAL_COMMUNITY): Payer: Self-pay

## 2018-02-05 NOTE — Telephone Encounter (Signed)
Patient called requesting a refill on her Focalin XR 40mg  and her Ritalin 10mg . She said that she had an upcoming appointment with you at your new practice but unexpected medical bills has came up and she is going to have to reschedule.  Please advise

## 2018-02-08 NOTE — Telephone Encounter (Signed)
She is not due for a refill until mid-September. I can refill then

## 2018-02-09 NOTE — Telephone Encounter (Signed)
Called patient and informed her of the message. She voiced understanding

## 2018-03-22 ENCOUNTER — Other Ambulatory Visit (HOSPITAL_COMMUNITY): Payer: Self-pay | Admitting: Obstetrics & Gynecology

## 2018-03-22 ENCOUNTER — Ambulatory Visit (HOSPITAL_BASED_OUTPATIENT_CLINIC_OR_DEPARTMENT_OTHER)
Admission: RE | Admit: 2018-03-22 | Discharge: 2018-03-22 | Disposition: A | Discharge status: Home / Self Care | Payer: 59 | Source: Ambulatory Visit | Attending: Obstetrics & Gynecology | Admitting: Obstetrics & Gynecology

## 2018-03-22 ENCOUNTER — Encounter (HOSPITAL_BASED_OUTPATIENT_CLINIC_OR_DEPARTMENT_OTHER): Payer: Self-pay

## 2018-03-22 DIAGNOSIS — Z1231 Encounter for screening mammogram for malignant neoplasm of breast: Secondary | ICD-10-CM

## 2018-03-23 ENCOUNTER — Other Ambulatory Visit (HOSPITAL_COMMUNITY): Payer: Self-pay | Admitting: Obstetrics & Gynecology

## 2018-03-23 DIAGNOSIS — R928 Other abnormal and inconclusive findings on diagnostic imaging of breast: Secondary | ICD-10-CM

## 2018-03-25 ENCOUNTER — Ambulatory Visit
Admission: RE | Admit: 2018-03-25 | Discharge: 2018-03-25 | Disposition: A | Discharge status: Home / Self Care | Payer: 59 | Source: Ambulatory Visit | Attending: Obstetrics & Gynecology | Admitting: Obstetrics & Gynecology

## 2018-03-25 DIAGNOSIS — R928 Other abnormal and inconclusive findings on diagnostic imaging of breast: Secondary | ICD-10-CM

## 2019-06-21 ENCOUNTER — Ambulatory Visit: Payer: 59 | Attending: Internal Medicine

## 2019-06-21 DIAGNOSIS — Z20822 Contact with and (suspected) exposure to covid-19: Secondary | ICD-10-CM

## 2019-06-22 LAB — NOVEL CORONAVIRUS, NAA: SARS-CoV-2, NAA: NOT DETECTED

## 2019-08-19 IMAGING — CR DG KNEE COMPLETE 4+V*L*
5 series · 5 of 5 positions shown · non-contrast
Comparison: None.

CLINICAL DATA: Fell on steps. Unable to bear weight. Anterior knee
pain below the patella.

EXAM:
LEFT KNEE - COMPLETE 4+ VIEW

[t knee ap left (1 of 2)]
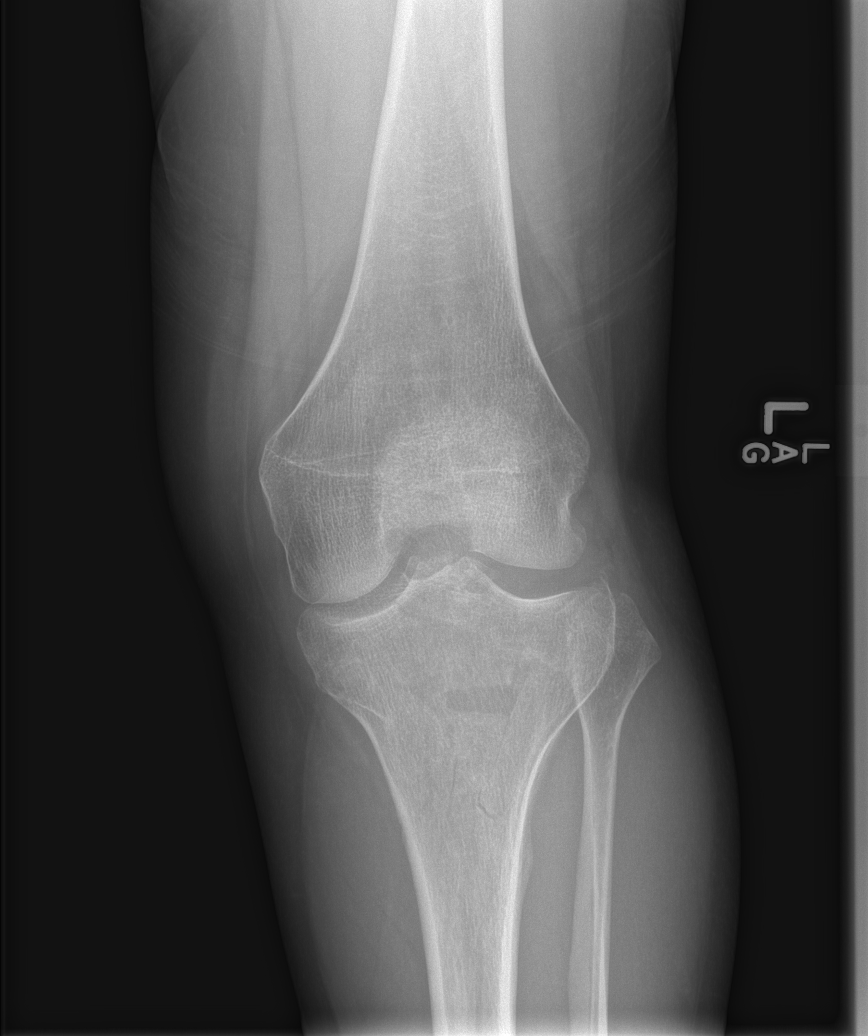

[t knee ap left (2 of 2)]
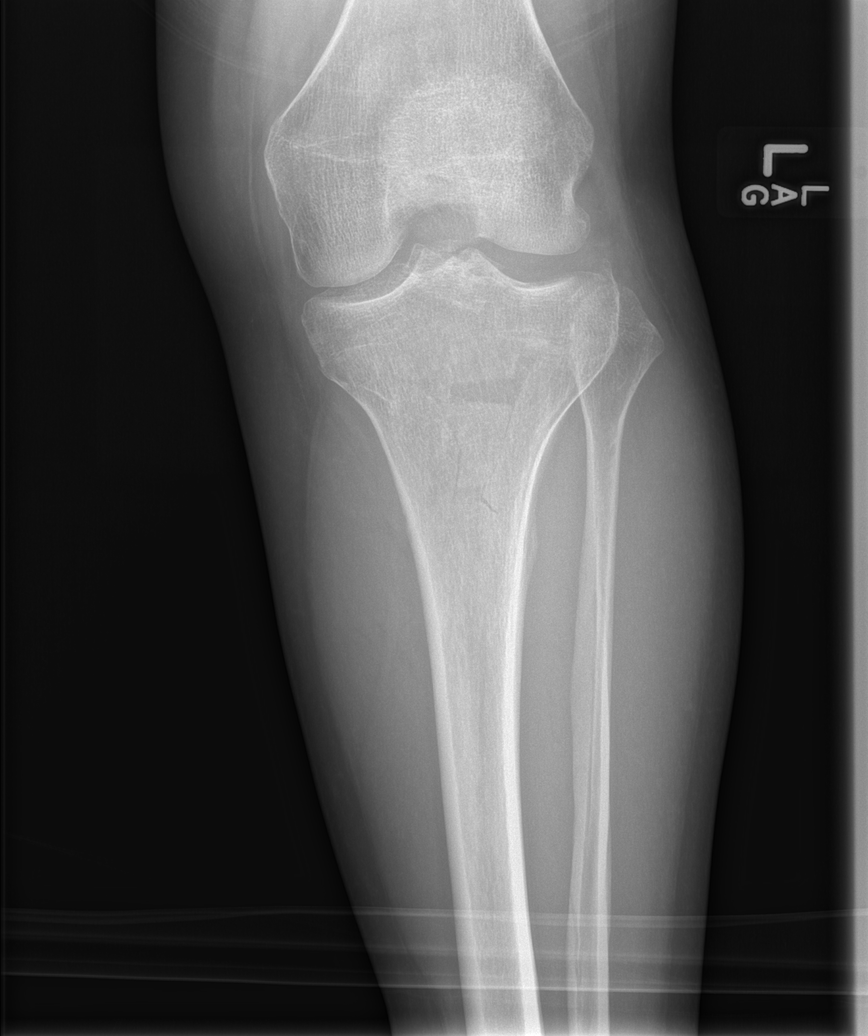

[x knee obl left (1 of 2)]
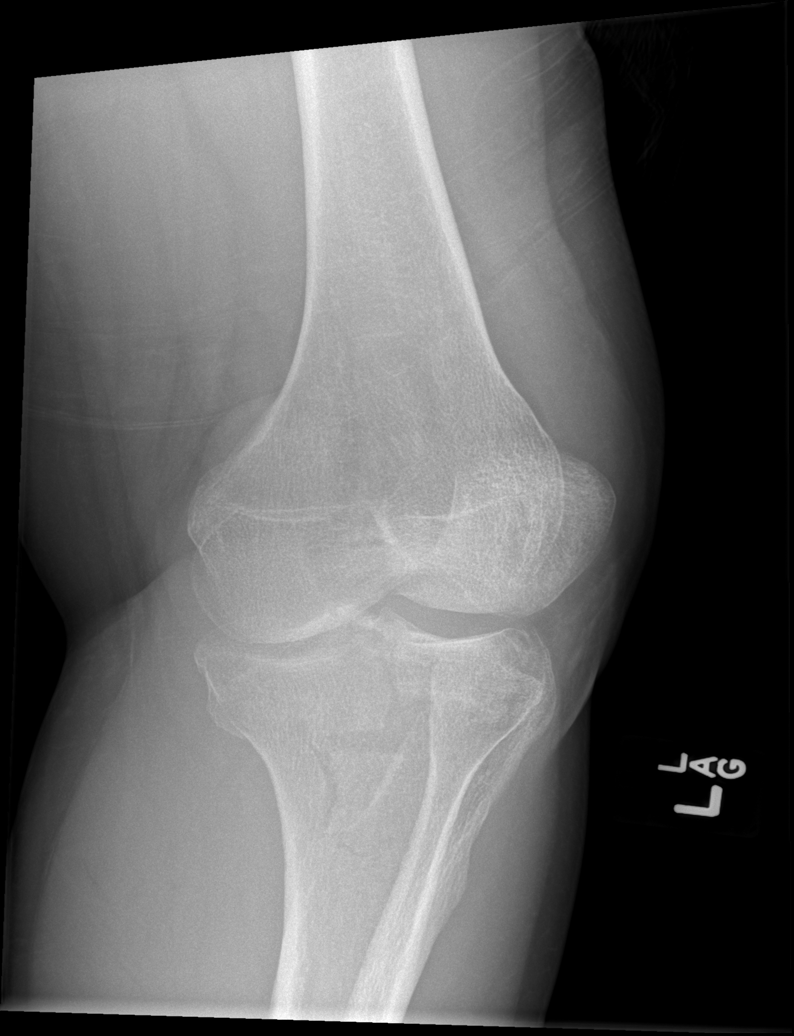

[x knee obl left (2 of 2)]
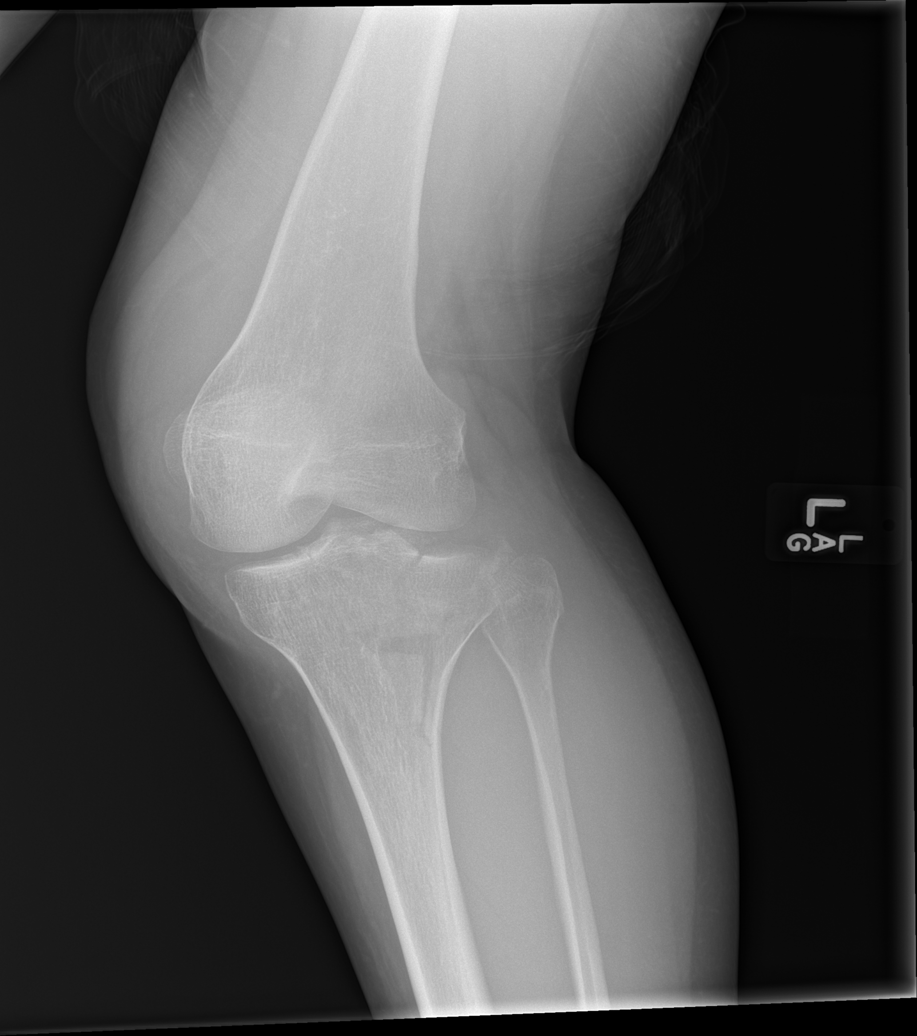

[x knee lat left]
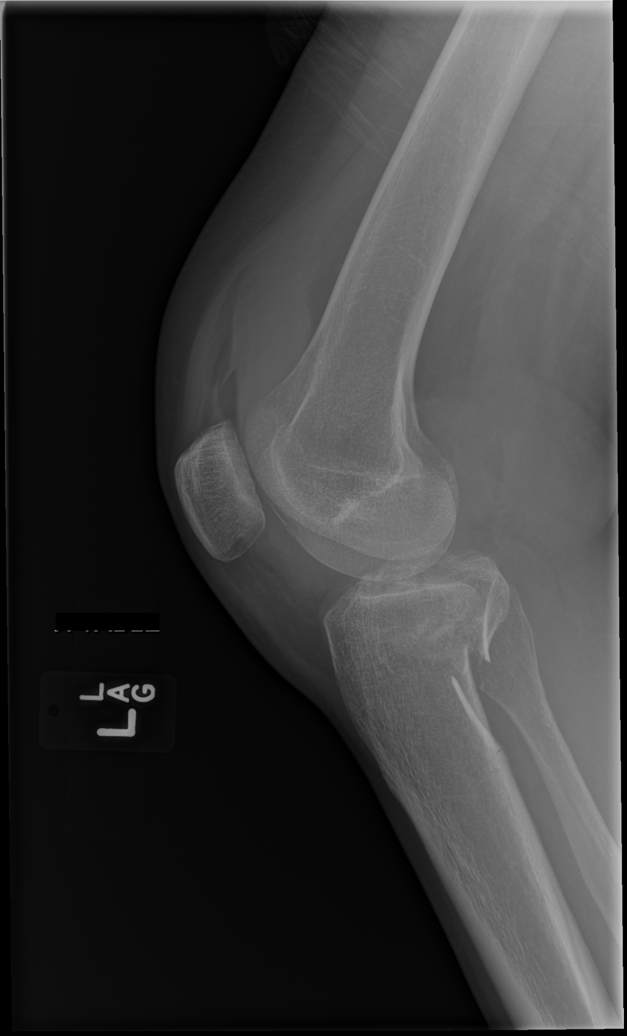

[5 of 5 positions shown; findings below may reference images not displayed]

FINDINGS: Comminuted fractures of the left proximal tibial metaphysis
extending to the lateral tibial plateau and to the medial tibial
plateau. Mild depression of the lateral tibial plateau. Mild
displacement of the posterior tibial plateau fragment. Associated
hemarthrosis. Distal femur and patella appear intact.
IMPRESSION: Comminuted fractures of the left proximal tibial metaphysis
extending to the medial and lateral tibial plateau and with
displacement of the posterior tibial plateau fragment. Associated
hemarthrosis.

## 2019-09-29 ENCOUNTER — Ambulatory Visit: Payer: Self-pay | Attending: Internal Medicine

## 2019-09-29 DIAGNOSIS — Z23 Encounter for immunization: Secondary | ICD-10-CM

## 2019-09-29 NOTE — Progress Notes (Signed)
   Covid-19 Vaccination Clinic  Name:  Alice [name]    MRN: [id number] DOB: September 04, 1975  09/29/2019  [name] was observed post Covid-19 immunization for 15 minutes without incident. She was provided with Vaccine Information Sheet and instruction to access the V-Safe system.   [name] was instructed to call 911 with any severe reactions post vaccine: Marland Kitchen Difficulty breathing  . Swelling of face and throat  . A fast heartbeat  . A bad rash all over body  . Dizziness and weakness   Immunizations Administered    Name Date Dose VIS Date Route   Pfizer COVID-19 Vaccine 09/29/2019 12:31 PM 0.3 mL 05/26/2019 Intramuscular   Manufacturer: [employer], [employer]   Lot: [id number]   NDC: [address]-1

## 2019-10-24 ENCOUNTER — Ambulatory Visit: Payer: Self-pay | Attending: Internal Medicine

## 2020-06-21 ENCOUNTER — Ambulatory Visit: Payer: Self-pay

## 2020-07-10 ENCOUNTER — Telehealth (INDEPENDENT_AMBULATORY_CARE_PROVIDER_SITE_OTHER): Payer: 59 | Admitting: Nurse Practitioner

## 2020-07-10 ENCOUNTER — Encounter: Payer: Self-pay | Admitting: Nurse Practitioner

## 2020-07-10 VITALS — BP 117/75 | Temp 99.5°F | Ht 63.0 in | Wt 145.0 lb

## 2020-07-10 DIAGNOSIS — U071 COVID-19: Secondary | ICD-10-CM | POA: Diagnosis not present

## 2020-07-10 DIAGNOSIS — R058 Other specified cough: Secondary | ICD-10-CM | POA: Diagnosis not present

## 2020-07-10 DIAGNOSIS — Z20822 Contact with and (suspected) exposure to covid-19: Secondary | ICD-10-CM

## 2020-07-10 DIAGNOSIS — Z7689 Persons encountering health services in other specified circumstances: Secondary | ICD-10-CM

## 2020-07-10 DIAGNOSIS — R0989 Other specified symptoms and signs involving the circulatory and respiratory systems: Secondary | ICD-10-CM | POA: Diagnosis not present

## 2020-07-10 DIAGNOSIS — J3489 Other specified disorders of nose and nasal sinuses: Secondary | ICD-10-CM | POA: Diagnosis not present

## 2020-07-10 MED ORDER — ALBUTEROL SULFATE HFA 108 (90 BASE) MCG/ACT IN AERS: 2.0000 | INHALATION_SPRAY | Freq: Four times a day (QID) | RESPIRATORY_TRACT | 0 refills | Status: DC | PRN

## 2020-07-10 MED ORDER — AZITHROMYCIN 250 MG PO TABS: ORAL_TABLET | ORAL | 0 refills | Status: AC

## 2020-07-10 NOTE — Patient Instructions (Signed)

## 2020-07-10 NOTE — Progress Notes (Signed)
Virtual Visit via video    This visit type was conducted due to national recommendations for restrictions regarding the COVID-19 Pandemic (e.g. social distancing) in an effort to limit this patient's exposure and mitigate transmission in our community.  Due to her co-morbid illnesses, this patient is at least at moderate risk for complications without adequate follow up.  This format is felt to be most appropriate for this patient at this time.  All issues noted in this document were discussed and addressed.  A limited physical exam was performed with this format.    This visit type was conducted due to national recommendations for restrictions regarding the COVID-19 Pandemic (e.g. social distancing) in an effort to limit this patient's exposure and mitigate transmission in our community.  Patients identity confirmed using two different identifiers.  This format is felt to be most appropriate for this patient at this time.  All issues noted in this document were discussed and addressed.  No physical exam was performed (except for noted visual exam findings with Video Visits).    Date:  07/10/2020   ID:  Alice [name], DOB 1975-06-25, MRN [id number]  Patient Location:  home  Provider location:   Office  Chief Complaint:  She is still having symptoms of COVID including cough, stuffy nose, congestion, sore throat, bodyaches and some fatigue.   History of Present Illness:    Alice [name] is a 45 y.o. female who presents via video conferencing for a telehealth visit today.    The patient does have symptoms concerning for COVID-19 infection which includes cough, fatigue, stuffy nose and congestion, bodyaches.   Tested positive 2 times. Symptoms started on the 1/11. She is having cough, stuffy nose, congestion, sore throat, HA, cough dry and productive, fatigue, fever 99.5, bodyaches. She had really bad nausea. She has taken tylenol, Dayquil, Tessalon pearls not helping her much. She took  aleve for the bodyaches. She is taking a lot fluids. She has both her shots but not boosted. No shortness of breath. She is coughing up clear. 99.1 today.      Past Medical History:  Diagnosis Date  . ADHD (attention deficit hyperactivity disorder)   . Avascular necrosis of bone of hip (HCC)    RIGHT GRAFT  . Bipolar II disorder (HCC)   . Depression   . Nicotine dependence 03/06/2017  . PONV (postoperative nausea and vomiting)   . Wears glasses    Past Surgical History:  Procedure Laterality Date  . APPENDECTOMY  2000  . BREAST BIOPSY Right   . BREAST EXCISIONAL BIOPSY Right 2011  . BREAST SURGERY  06/2009   RIGHT BREAST BIOPSY-NEGATIVE  . DILATION AND CURETTAGE OF UTERUS    . FASCIOTOMY Left 03/04/2017   Procedure: ANTERIOR COMPARTMENT FASCIOTOMY;  Surgeon: Myrene Galas, MD;  Location: [facility] OR;  Service: Orthopedics;  Laterality: Left;  . HIP ARTHROSCOPY  2008, 2010   AVASCULAR NECROSIS--RIGHT HIP 2010  . KNEE SURGERY  1995   ACL  . ORIF TIBIA PLATEAU Left 03/04/2017   Procedure: OPEN REDUCTION INTERNAL FIXATION (ORIF) TIBIAL PLATEAU;  Surgeon: Myrene Galas, MD;  Location: [facility] OR;  Service: Orthopedics;  Laterality: Left;  . SHOULDER SURGERY  [phone]  . TONSILLECTOMY    . WISDOM TOOTH EXTRACTION       Current Meds  Medication Sig  . albuterol (VENTOLIN HFA) 108 (90 Base) MCG/ACT inhaler Inhale 2 puffs into the lungs every 6 (six) hours as needed for wheezing or shortness of breath.  Marland Kitchen  ARIPiprazole (ABILIFY) 10 MG tablet Take 1 tablet (10 mg total) by mouth daily.  Marland Kitchen azithromycin (ZITHROMAX Z-PAK) 250 MG tablet Take 2 tablets (500 mg) on  Day 1,  followed by 1 tablet (250 mg) once daily on Days 2 through 5.  . lamoTRIgine (LAMICTAL) 200 MG tablet Take 1 tablet (200 mg total) by mouth daily.  . modafinil (PROVIGIL) 200 MG tablet Take 1 tablet (200 mg total) by mouth daily.     Allergies:   Methotrexate derivatives and Chantix [varenicline tartrate]   Social History    Tobacco Use  . Smoking status: Former Smoker    Types: Cigarettes    Quit date: 07/03/2020    Years since quitting: 0.0  . Smokeless tobacco: Never Used  Vaping Use  . Vaping Use: Never used  Substance Use Topics  . Alcohol use: Yes    Comment: social  . Drug use: No     Family Hx: The patient's family history includes Breast cancer (age of onset: 90) in her maternal grandmother; Breast cancer (age of onset: 67) in her mother; Cancer in her father, maternal grandfather, maternal grandmother, and mother; Heart disease in her father; Hypertension in her father.  ROS:   Please see the history of present illness.    Review of Systems  Constitutional: Positive for fever and malaise/fatigue. Negative for chills.  HENT: Positive for congestion and sore throat. Negative for ear pain and sinus pain.   Respiratory: Positive for cough. Negative for shortness of breath.   Cardiovascular: Negative for chest pain.  Gastrointestinal: Negative for diarrhea and nausea.  Musculoskeletal: Positive for myalgias.    All other systems reviewed and are negative.   Labs/Other Tests and Data Reviewed:    Recent Labs: No results found for requested labs within last 8760 hours.   Recent Lipid Panel Lab Results  Component Value Date/Time   CHOL 166 11/26/2010 05:37 AM   TRIG 89 11/26/2010 05:37 AM   HDL 50 11/26/2010 05:37 AM   CHOLHDL 3.3 11/26/2010 05:37 AM   LDLCALC 98 11/26/2010 05:37 AM    Wt Readings from Last 3 Encounters:  07/10/20 145 lb (65.8 kg)  02/23/17 130 lb (59 kg)  12/11/16 127 lb 9.6 oz (57.9 kg)     Exam:    Vital Signs:  BP 117/75   Temp 99.5 F (37.5 C) (Oral)   Ht 5\' 3"  (1.6 m)   Wt 145 lb (65.8 kg)   LMP 06/24/2020   BMI 25.69 kg/m     Physical Exam Constitutional:      Appearance: Normal appearance.  HENT:     Head: Normocephalic and atraumatic.  Pulmonary:     Effort: Pulmonary effort is normal. No respiratory distress.  Neurological:     Mental  Status: She is alert.     ASSESSMENT & PLAN:     1. Encounter to establish care -patient will make an appointment for a physical when she feels better from Covid.   2. COVID-19 -Pt. Tested positive twice for COVID-19  -Educated patient to stay well hydrated.  -take otc tylenol/ibuprofen as needed for fever, bodyaches  -Educated patient to take Vitamin C, D and zinc  -Azithromycin (Z-pak) sent to pharmacy  3. Cough with exposure to Covid 19 virus -Take OTC delsym as needed for cough  -Take albuterol inhaler as needed for wheezing or SOB.   4. Sinus pressure  -take OTC Mucinex/antihistmines and nasal spray as needed.   5. Chest congestion   -azithromycin (  Z-pak) sent to pharmacy.  -Educated patient if symptoms get worse or do not get better to give [place] a call or if she experiences any chest pain or SOB to go to the nearest ED.   COVID-19 Education: The signs and symptoms of COVID-19 were discussed with the patient and how to seek care for testing (follow up with PCP or arrange E-visit).  The importance of social distancing was discussed today.  Patient Risk:   After full review of this patients clinical status, I feel that they are at least moderate risk at this time.  Time:   Today, I have spent 20 minutes/ seconds with the patient with telehealth technology discussing above diagnoses.     Medication Adjustments/Labs and Tests Ordered: Current medicines are reviewed at length with the patient today.  Concerns regarding medicines are outlined above.   Tests Ordered: No orders of the defined types were placed in this encounter.   Medication Changes: Meds ordered this encounter  Medications  . azithromycin (ZITHROMAX Z-PAK) 250 MG tablet    Sig: Take 2 tablets (500 mg) on  Day 1,  followed by 1 tablet (250 mg) once daily on Days 2 through 5.    Dispense:  6 each    Refill:  0  . albuterol (VENTOLIN HFA) 108 (90 Base) MCG/ACT inhaler    Sig: Inhale 2 puffs into the lungs  every 6 (six) hours as needed for wheezing or shortness of breath.    Dispense:  8 g    Refill:  0    Disposition:  Follow up if her symptoms get worse or does not get better.   Signed, Charlesetta Ivory, NP

## 2020-07-18 ENCOUNTER — Other Ambulatory Visit: Payer: Self-pay | Admitting: Nurse Practitioner

## 2020-07-18 MED ORDER — GUAIFENESIN-CODEINE 200-10 MG/5ML PO LIQD
5.0000 mL | Freq: Three times a day (TID) | ORAL | 0 refills | Status: DC | PRN
Start: 2020-07-18 — End: 2022-10-27

## 2020-12-13 ENCOUNTER — Other Ambulatory Visit: Payer: Self-pay

## 2020-12-13 ENCOUNTER — Ambulatory Visit (INDEPENDENT_AMBULATORY_CARE_PROVIDER_SITE_OTHER): Payer: Self-pay

## 2020-12-13 ENCOUNTER — Ambulatory Visit (INDEPENDENT_AMBULATORY_CARE_PROVIDER_SITE_OTHER): Payer: Self-pay | Admitting: Podiatry

## 2020-12-13 ENCOUNTER — Other Ambulatory Visit: Payer: Self-pay | Admitting: Podiatry

## 2020-12-13 DIAGNOSIS — M76829 Posterior tibial tendinitis, unspecified leg: Secondary | ICD-10-CM

## 2020-12-13 DIAGNOSIS — M76822 Posterior tibial tendinitis, left leg: Secondary | ICD-10-CM

## 2020-12-13 DIAGNOSIS — M79672 Pain in left foot: Secondary | ICD-10-CM

## 2020-12-13 MED ORDER — MELOXICAM 15 MG PO TABS: 15.0000 mg | ORAL_TABLET | Freq: Every day | ORAL | 0 refills | Status: DC

## 2020-12-13 NOTE — Progress Notes (Signed)
  Subjective:  Patient ID: Alice [name], female    DOB: Jul 13, 1975,  MRN: [id number]  Chief Complaint  Patient presents with   Foot Problem    Reports heel pains x 2 weeks. Unable to put 100% wt on it. Swelling.     45 y.o. female presents with the above complaint. History confirmed with patient. States she was walking in unsupportive shoes and she experienced pain ever since.  Objective:  Physical Exam: warm, good capillary refill, no trophic changes or ulcerative lesions, normal DP and PT pulses, and normal sensory exam. Left Foot: POP at the PTT , reduced inv strength. Pain on inv/ev. POP medial calc tuber.  No images are attached to the encounter.  Radiographs: X-ray of the left foot: no fracture, dislocation, swelling or degenerative changes noted Assessment:   1. PTTD (posterior tibial tendon dysfunction)   2. Posterior tibial tendinitis of left lower extremity    Plan:  Patient was evaluated and treated and all questions answered.  Posterior Tibial Tendonitis -X-rays reviewed as above -Soft cast applied with unna boot and coban.  -Recc ankle stabilizing brace, offered but declined 2/2 cost. Given options for how to get acceptable brace on [employer]. -Rx mobic -F/u in 1 month for recheck.  Return in about 4 weeks (around 01/10/2021).

## 2020-12-13 NOTE — Patient Instructions (Signed)
On [employer] look for an ankle stabilizing brace that has crossing straps. Some to consider are:  BIOSKIN [employer]Lok Ankle Brace  SNEINO Ankle Brace for Women & Men

## 2021-01-10 ENCOUNTER — Other Ambulatory Visit: Payer: Self-pay | Admitting: Podiatry

## 2021-01-13 NOTE — Telephone Encounter (Signed)
Please advise 

## 2021-01-17 ENCOUNTER — Ambulatory Visit: Payer: Self-pay | Admitting: Podiatry

## 2021-01-28 ENCOUNTER — Ambulatory Visit (INDEPENDENT_AMBULATORY_CARE_PROVIDER_SITE_OTHER): Payer: Self-pay | Admitting: Podiatry

## 2021-01-28 ENCOUNTER — Other Ambulatory Visit: Payer: Self-pay

## 2021-01-28 DIAGNOSIS — M76829 Posterior tibial tendinitis, unspecified leg: Secondary | ICD-10-CM

## 2021-01-28 DIAGNOSIS — M76822 Posterior tibial tendinitis, left leg: Secondary | ICD-10-CM

## 2021-01-28 MED ORDER — METHYLPREDNISOLONE 4 MG PO TBPK: ORAL_TABLET | ORAL | 0 refills | Status: DC

## 2021-01-28 NOTE — Progress Notes (Signed)
  Subjective:  Patient ID: Alice [name], female    DOB: December 04, 1975,  MRN: [id number]  Chief Complaint  Patient presents with   Foot Pain    Patient states left foot is worst than the last visit , there is now swelling on both sides of he foot and she states it is hard for her to walk , it is pain . She says that meloxicam is not working so she is taking over the counter medication.    45 y.o. female presents with the above complaint. History confirmed with patient.    Objective:  Physical Exam: warm, good capillary refill, no trophic changes or ulcerative lesions, normal DP and PT pulses, and normal sensory exam. Left Foot: POP at the PTT , reduced inv strength. Pain on inv/ev. POP medial calc tuber. Assessment:   1. PTTD (posterior tibial tendon dysfunction)   2. Posterior tibial tendinitis of left lower extremity    Plan:  Patient was evaluated and treated and all questions answered.  Posterior Tibial Tendonitis -Hold off injection today - consider next visit if pain persists. -Educated on use of brace - goal to lift the arch. -Rx Meloxicam  -Recc compression to alleviate swelling.  Return in about 3 weeks (around 02/18/2021).

## 2021-02-25 ENCOUNTER — Ambulatory Visit: Payer: Self-pay | Admitting: Podiatry

## 2021-03-14 ENCOUNTER — Ambulatory Visit: Payer: Self-pay | Admitting: Podiatry

## 2021-04-11 ENCOUNTER — Ambulatory Visit: Payer: Self-pay | Admitting: Podiatry

## 2022-10-27 ENCOUNTER — Other Ambulatory Visit (HOSPITAL_COMMUNITY)
Admission: RE | Admit: 2022-10-27 | Discharge: 2022-10-27 | Disposition: A | Discharge status: Home / Self Care | Payer: Managed Care, Other (non HMO) | Source: Ambulatory Visit | Attending: Obstetrics & Gynecology | Admitting: Obstetrics & Gynecology

## 2022-10-27 ENCOUNTER — Ambulatory Visit (INDEPENDENT_AMBULATORY_CARE_PROVIDER_SITE_OTHER): Payer: Managed Care, Other (non HMO) | Admitting: Obstetrics & Gynecology

## 2022-10-27 ENCOUNTER — Encounter (HOSPITAL_BASED_OUTPATIENT_CLINIC_OR_DEPARTMENT_OTHER): Payer: Self-pay | Admitting: Obstetrics & Gynecology

## 2022-10-27 VITALS — BP 111/76 | HR 81 | Ht 63.0 in | Wt 152.8 lb

## 2022-10-27 DIAGNOSIS — N926 Irregular menstruation, unspecified: Secondary | ICD-10-CM | POA: Diagnosis not present

## 2022-10-27 DIAGNOSIS — Z1389 Encounter for screening for other disorder: Secondary | ICD-10-CM

## 2022-10-27 DIAGNOSIS — Z124 Encounter for screening for malignant neoplasm of cervix: Secondary | ICD-10-CM | POA: Insufficient documentation

## 2022-10-27 DIAGNOSIS — Z01419 Encounter for gynecological examination (general) (routine) without abnormal findings: Secondary | ICD-10-CM

## 2022-10-27 DIAGNOSIS — Z96641 Presence of right artificial hip joint: Secondary | ICD-10-CM

## 2022-10-27 DIAGNOSIS — R5383 Other fatigue: Secondary | ICD-10-CM | POA: Diagnosis not present

## 2022-10-27 MED ORDER — NORETHIN ACE-ETH ESTRAD-FE 1-20 MG-MCG PO TABS: 1.0000 | ORAL_TABLET | Freq: Every day | ORAL | 0 refills | Status: DC

## 2022-10-27 NOTE — Progress Notes (Addendum)
47 y.o. G0P0 Single White or Caucasian female here for annual exam/new patient appointment.  Former patient of Maryelizabeth Rowan.  Cycles have changed in the last couple of years.  Cycles are between 3- 5 weeks with flow has gotten lighter.   First day is heavier and then lightens with flow lasting about 4 days.  This last cycle was heavy for almost 7 days.    Mother with hx of triple negative breast.  Did have negative genetic testing.  Increased screening with breast MRI discussed.  Pt is open to considering this next year.    Having some fatigue.  Has seen sleep specialist.  She does have some mild sleep apnea.            Sexually active: Yes.    The current method of family planning is none.    Smoker:  former smoker  Health Maintenance: Pap:  01/29/2011 Negative History of abnormal Pap:  no MMG:  03/18/2022 Negative Colonoscopy:  guidelines reviewed Screening Labs: ordered today   reports that she quit smoking about 2 years ago. Her smoking use included cigarettes. She has never used smokeless tobacco. She reports current alcohol use. She reports that she does not use drugs.  Past Medical History:  Diagnosis Date   ADHD (attention deficit hyperactivity disorder)    Avascular necrosis of bone of hip (HCC) 2008   cause never determined, bone graft   Bipolar II disorder (HCC)    Depression    PONV (postoperative nausea and vomiting)    Smoking history 03/06/2017   Wears glasses     Past Surgical History:  Procedure Laterality Date   APPENDECTOMY  2000   ARTHROSCOPY WITH ANTERIOR CRUCIATE LIGAMENT (ACL) REPAIR WITH ANTERIOR TIBILIAS GRAFT  1995   ACL   BREAST EXCISIONAL BIOPSY Right 2011   DILATION AND CURETTAGE OF UTERUS     miscarriage   FASCIOTOMY Left 03/04/2017   Procedure: ANTERIOR COMPARTMENT FASCIOTOMY;  Surgeon: Myrene Galas, MD;  Location: [facility] OR;  Service: Orthopedics;  Laterality: Left;   HIP ARTHROSCOPY  2008, 2010   AVASCULAR NECROSIS--RIGHT HIP 2010   ORIF TIBIA  PLATEAU Left 03/04/2017   Procedure: OPEN REDUCTION INTERNAL FIXATION (ORIF) TIBIAL PLATEAU;  Surgeon: Myrene Galas, MD;  Location: [facility] OR;  Service: Orthopedics;  Laterality: Left;   SHOULDER SURGERY  1996,2003.2008   TONSILLECTOMY     TOTAL HIP ARTHROPLASTY Right 2013   posterior approach, [facility]   WISDOM TOOTH EXTRACTION      Current Outpatient Medications  Medication Sig Dispense Refill   ALPRAZolam (XANAX) 1 MG tablet Take 0.5-1 mg by mouth daily as needed.     ARIPiprazole (ABILIFY) 10 MG tablet Take 1 tablet (10 mg total) by mouth daily. 90 tablet 1   Dexmethylphenidate HCl 30 MG CP24      dextroamphetamine (DEXEDRINE SPANSULE) 10 MG 24 hr capsule Take by mouth.     lamoTRIgine (LAMICTAL) 200 MG tablet Take 1 tablet (200 mg total) by mouth daily. 90 tablet 1   modafinil (PROVIGIL) 200 MG tablet Take 1 tablet (200 mg total) by mouth daily. 90 tablet 0   No current facility-administered medications for this visit.    Family History  Problem Relation Age of Onset   Breast cancer Mother 50       BRACA NEGATIVE   Cancer Mother    Hypertension Father    Heart disease Father    Cancer Father        PROSTATE   Breast  cancer Maternal Grandmother 60   Cancer Maternal Grandmother    Cancer Maternal Grandfather     ROS: Constitutional: negative Genitourinary: irregular bleeding  Exam:   BP 111/76 (BP Location: Right Arm, Patient Position: Sitting, Cuff Size: Normal)   Pulse 81   Ht 5\' 3"  (1.6 m) Comment: Reported  Wt 152 lb 12.8 oz (69.3 kg)   LMP 09/15/2022 (Approximate)   BMI 27.07 kg/m   Height: 5\' 3"  (160 cm) (Reported)  General appearance: alert, cooperative and appears stated age Head: Normocephalic, without obvious abnormality, atraumatic Neck: no adenopathy, supple, symmetrical, trachea midline and thyroid normal to inspection and palpation Lungs: clear to auscultation bilaterally Breasts: normal appearance, no masses or tenderness Heart: regular rate and  rhythm Abdomen: soft, non-tender; bowel sounds normal; no masses,  no organomegaly Extremities: extremities normal, atraumatic, no cyanosis or edema Skin: Skin color, texture, turgor normal. No rashes or lesions Lymph nodes: Cervical, supraclavicular, and axillary nodes normal. No abnormal inguinal nodes palpated Neurologic: Grossly normal   Pelvic: External genitalia:  no lesions              Urethra:  normal appearing urethra with no masses, tenderness or lesions              Bartholins and Skenes: normal                 Vagina: normal appearing vagina with normal color and no discharge, no lesions              Cervix: no lesions              Pap taken: Yes.   Bimanual Exam:  Uterus:  normal size, contour, position, consistency, mobility, non-tender              Adnexa: normal adnexa and no mass, fullness, tenderness               Rectovaginal: Confirms               Anus:  normal sphincter tone, no lesions  Chaperone, Ina Homes, CMA, was present for exam.  Assessment/Plan: 1. Well woman exam with routine gynecological exam - Pap smear and HR HPV obtained today - Mammogram is being done yearly - Colonoscopy guideliens reviewed - lab work done ordered - vaccines reviewed/updated  2. Cervical cancer screening - Cytology - PAP( [facility])  3. Fatigue, unspecified type - CBC - Iron, TIBC and Ferritin Panel  4. Irregular bleeding - Follicle stimulating hormone  5. Status post right hip replacement  6.  Family history of breast cancer - breast MRI discussed - expanded genetic testing for pt discussed - high risk breast clinic consult discussed as well

## 2022-10-28 LAB — CBC
Hematocrit: 43.7 % (ref 34.0–46.6)
Hemoglobin: 14.4 g/dL (ref 11.1–15.9)
MCH: 30.6 pg (ref 26.6–33.0)
MCHC: 33 g/dL (ref 31.5–35.7)
MCV: 93 fL (ref 79–97)
Platelets: 297 10*3/uL (ref 150–450)
RBC: 4.71 x10E6/uL (ref 3.77–5.28)
RDW: 11.8 % (ref 11.7–15.4)
WBC: 9 10*3/uL (ref 3.4–10.8)

## 2022-10-28 LAB — IRON AND TIBC
Iron Saturation: 11 % — ABNORMAL LOW (ref 15–55)
Iron: 34 ug/dL (ref 27–159)
Total Iron Binding Capacity: 317 ug/dL (ref 250–450)
UIBC: 283 ug/dL (ref 131–425)

## 2022-10-28 LAB — FOLLICLE STIMULATING HORMONE: FSH: 4.9 m[IU]/mL

## 2022-10-28 LAB — FERRITIN: Ferritin: 43 ng/mL (ref 15–150)

## 2022-10-28 LAB — ESTRADIOL: Estradiol: 110 pg/mL

## 2022-10-29 LAB — CYTOLOGY - PAP
Comment: NEGATIVE
Diagnosis: NEGATIVE
High risk HPV: NEGATIVE

## 2022-12-23 ENCOUNTER — Telehealth (HOSPITAL_BASED_OUTPATIENT_CLINIC_OR_DEPARTMENT_OTHER): Payer: Self-pay | Admitting: *Deleted

## 2022-12-23 NOTE — Telephone Encounter (Signed)
Pt called reporting some spotting that has occurred after starting OCP about 3 weeks ago. She states that it has now turned into a light period. Advised that there can be some irregular bleeding when starting/changing birth control. Advised pt to continue taking medication as prescribed. Pt will let us know if irregular bleeding continues.

## 2023-01-06 ENCOUNTER — Other Ambulatory Visit (HOSPITAL_BASED_OUTPATIENT_CLINIC_OR_DEPARTMENT_OTHER): Payer: Self-pay | Admitting: Obstetrics & Gynecology

## 2023-01-06 ENCOUNTER — Encounter (HOSPITAL_BASED_OUTPATIENT_CLINIC_OR_DEPARTMENT_OTHER): Payer: Self-pay | Admitting: Obstetrics & Gynecology

## 2023-01-06 DIAGNOSIS — N926 Irregular menstruation, unspecified: Secondary | ICD-10-CM

## 2023-01-06 MED ORDER — DROSPIRENONE-ETHINYL ESTRADIOL 3-0.02 MG PO TABS
1.0000 | ORAL_TABLET | Freq: Every day | ORAL | 3 refills | Status: DC
Start: 2023-01-06 — End: 2023-01-27

## 2023-01-18 ENCOUNTER — Other Ambulatory Visit (HOSPITAL_BASED_OUTPATIENT_CLINIC_OR_DEPARTMENT_OTHER): Payer: Self-pay | Admitting: Obstetrics & Gynecology

## 2023-01-27 ENCOUNTER — Other Ambulatory Visit (HOSPITAL_BASED_OUTPATIENT_CLINIC_OR_DEPARTMENT_OTHER): Payer: Self-pay

## 2023-01-27 DIAGNOSIS — N926 Irregular menstruation, unspecified: Secondary | ICD-10-CM

## 2023-01-27 MED ORDER — DROSPIRENONE-ETHINYL ESTRADIOL 3-0.02 MG PO TABS
1.0000 | ORAL_TABLET | Freq: Every day | ORAL | 3 refills | Status: DC
Start: 2023-01-27 — End: 2023-12-30

## 2023-02-04 ENCOUNTER — Encounter (HOSPITAL_BASED_OUTPATIENT_CLINIC_OR_DEPARTMENT_OTHER): Payer: Self-pay | Admitting: Obstetrics & Gynecology

## 2023-02-04 ENCOUNTER — Ambulatory Visit (HOSPITAL_BASED_OUTPATIENT_CLINIC_OR_DEPARTMENT_OTHER): Payer: Managed Care, Other (non HMO) | Admitting: Obstetrics & Gynecology

## 2023-02-04 VITALS — BP 121/77 | HR 72 | Ht 63.0 in | Wt 148.4 lb

## 2023-02-04 DIAGNOSIS — Z96641 Presence of right artificial hip joint: Secondary | ICD-10-CM | POA: Diagnosis not present

## 2023-02-04 DIAGNOSIS — N926 Irregular menstruation, unspecified: Secondary | ICD-10-CM

## 2023-02-04 DIAGNOSIS — M87051 Idiopathic aseptic necrosis of right femur: Secondary | ICD-10-CM

## 2023-02-04 NOTE — Progress Notes (Signed)
GYNECOLOGY  VISIT  CC:   medication follow up  HPI: 47 y.o. G1P0010 Single White or Caucasian female here for follow up after changing to Yaz.  With loestrin she would have cramping and irregular bleeding for about half of the pack.  She was switched to [facility] and is on week 2.  She knows she hasn't been on this long enough to know if it is going to help.  Discussed increasing dosage of estrogen if does not have good cycle control with Yaz.  She will let me know after using a month or two of the new rx.   Past Medical History:  Diagnosis Date   ADHD (attention deficit hyperactivity disorder)    Avascular necrosis of bone of hip (HCC) 2008   cause never determined, bone graft   Bipolar II disorder (HCC)    Depression    PONV (postoperative nausea and vomiting)    Smoking history 03/06/2017   Wears glasses     MEDS:   Current Outpatient Medications on File Prior to Visit  Medication Sig Dispense Refill   ALPRAZolam (XANAX) 1 MG tablet Take 0.5-1 mg by mouth daily as needed.     ARIPiprazole (ABILIFY) 10 MG tablet Take 1 tablet (10 mg total) by mouth daily. 90 tablet 1   Dexmethylphenidate HCl 30 MG CP24      dextroamphetamine (DEXEDRINE SPANSULE) 10 MG 24 hr capsule Take by mouth.     drospirenone-ethinyl estradiol (YAZ) 3-0.02 MG tablet Take 1 tablet by mouth daily. 84 tablet 3   lamoTRIgine (LAMICTAL) 200 MG tablet Take 1 tablet (200 mg total) by mouth daily. 90 tablet 1   modafinil (PROVIGIL) 200 MG tablet Take 1 tablet (200 mg total) by mouth daily. 90 tablet 0   No current facility-administered medications on file prior to visit.    ALLERGIES: Methotrexate derivatives and Chantix [varenicline tartrate]  SH:  single, former smoker  Review of Systems  Constitutional: Negative.   Genitourinary:        Irregular menstrual bleeding    PHYSICAL EXAMINATION:    BP 121/77 (BP Location: Right Arm, Patient Position: Sitting, Cuff Size: Normal)   Pulse 72   Ht 5\' 3"  (1.6 m)   Wt  148 lb 6.4 oz (67.3 kg)   BMI 26.29 kg/m     Physical Exam Constitutional:      Appearance: Normal appearance.  Skin:    Findings: No rash.  Neurological:     General: No focal deficit present.     Mental Status: She is alert.  Psychiatric:        Mood and Affect: Mood normal.        Behavior: Behavior normal.    Assessment/Plan: 1. Irregular bleeding - pt has just switched to Yaz so unsure if this is helping at this point.  She will keep me posted and let me know if irregular bleeding continues after 1-2 months with new medication.  If does, will increase estrogen dosage in OCPs.

## 2023-02-06 ENCOUNTER — Encounter (HOSPITAL_BASED_OUTPATIENT_CLINIC_OR_DEPARTMENT_OTHER): Payer: Self-pay | Admitting: Obstetrics & Gynecology

## 2023-03-31 ENCOUNTER — Encounter (HOSPITAL_BASED_OUTPATIENT_CLINIC_OR_DEPARTMENT_OTHER): Payer: Self-pay | Admitting: Obstetrics & Gynecology

## 2023-08-25 ENCOUNTER — Encounter (HOSPITAL_BASED_OUTPATIENT_CLINIC_OR_DEPARTMENT_OTHER): Payer: Self-pay | Admitting: Obstetrics & Gynecology

## 2023-08-25 ENCOUNTER — Ambulatory Visit (INDEPENDENT_AMBULATORY_CARE_PROVIDER_SITE_OTHER): Admitting: Obstetrics & Gynecology

## 2023-08-25 VITALS — BP 127/68 | HR 85 | Ht 63.0 in | Wt 137.6 lb

## 2023-08-25 DIAGNOSIS — N912 Amenorrhea, unspecified: Secondary | ICD-10-CM

## 2023-08-25 DIAGNOSIS — R232 Flushing: Secondary | ICD-10-CM | POA: Diagnosis not present

## 2023-08-25 DIAGNOSIS — N926 Irregular menstruation, unspecified: Secondary | ICD-10-CM

## 2023-08-25 NOTE — Progress Notes (Signed)
 GYNECOLOGY  VISIT  CC:   amenorrhea  HPI: 48 y.o. G18P0010 Single White or Caucasian female here for concerns about her OCPs and changes in bleeding pattern.  She is on Yaz and does have some irregular spotting.  Last bleeding or spotting was five weeks ago.  She is having some hot flashes and night sweats.    Had thyroid testing 04/2023 and this was normal.    She had a colonoscopy 08/10/2023 and this was negative.  Follow up 10 years.  She is wondering if this had anything to do with skipping her cycle at it was around the time her cycle was due.    She has done multiple UPTs at home and they have all been negative.   Past Medical History:  Diagnosis Date   ADHD (attention deficit hyperactivity disorder)    Avascular necrosis of bone of hip (HCC) 2008   cause never determined, bone graft   Bipolar II disorder (HCC)    Depression    PONV (postoperative nausea and vomiting)    Smoking history 03/06/2017   Wears glasses     MEDS:   Current Outpatient Medications on File Prior to Visit  Medication Sig Dispense Refill   ALPRAZolam (XANAX) 1 MG tablet Take 0.5-1 mg by mouth daily as needed.     ARIPiprazole (ABILIFY) 10 MG tablet Take 1 tablet (10 mg total) by mouth daily. 90 tablet 1   Dexmethylphenidate HCl 30 MG CP24      dextroamphetamine (DEXEDRINE SPANSULE) 10 MG 24 hr capsule Take by mouth.     drospirenone-ethinyl estradiol (YAZ) 3-0.02 MG tablet Take 1 tablet by mouth daily. 84 tablet 3   lamoTRIgine (LAMICTAL) 200 MG tablet Take 1 tablet (200 mg total) by mouth daily. 90 tablet 1   modafinil (PROVIGIL) 200 MG tablet Take 1 tablet (200 mg total) by mouth daily. 90 tablet 0   No current facility-administered medications on file prior to visit.    ALLERGIES: Methotrexate derivatives and Chantix [varenicline tartrate]  SH:  single, non smoker  Review of Systems  Constitutional: Negative.   Genitourinary:        Skipped menses    PHYSICAL EXAMINATION:    BP 127/68  (BP Location: Right Arm, Patient Position: Sitting, Cuff Size: Normal)   Pulse 85   Ht 5\' 3"  (1.6 m)   Wt 137 lb 9.6 oz (62.4 kg)   LMP 07/21/2023   BMI 24.37 kg/m     Physical Exam Constitutional:      Appearance: Normal appearance.  Neurological:     General: No focal deficit present.     Mental Status: She is alert.  Psychiatric:        Mood and Affect: Mood normal.    Assessment/Plan: 1. Amenorrhea (Primary) - will check blood work today for causes of missed cycle.  Did have normal TSH 04/2023. - Beta hCG quant (ref lab) - Follicle stimulating hormone - Prolactin - Estradiol  2. Hot flashes - also had normal CBC last May

## 2023-08-26 LAB — ESTRADIOL: Estradiol: <5 pg/mL

## 2023-08-26 LAB — PROLACTIN: Prolactin: 14.3 ng/mL (ref 4.8–33.4)

## 2023-08-26 LAB — BETA HCG QUANT (REF LAB): hCG Quant: <1 m[IU]/mL

## 2023-08-26 LAB — FOLLICLE STIMULATING HORMONE: FSH: 3.3 m[IU]/mL

## 2023-08-26 NOTE — Addendum Note (Signed)
 Addended by: Jerene Bears on: 08/26/2023 06:10 AM   Modules accepted: Orders

## 2023-09-03 ENCOUNTER — Other Ambulatory Visit (HOSPITAL_BASED_OUTPATIENT_CLINIC_OR_DEPARTMENT_OTHER)

## 2023-09-09 ENCOUNTER — Other Ambulatory Visit (HOSPITAL_BASED_OUTPATIENT_CLINIC_OR_DEPARTMENT_OTHER)

## 2023-09-09 DIAGNOSIS — N926 Irregular menstruation, unspecified: Secondary | ICD-10-CM

## 2023-09-10 LAB — ESTRADIOL: Estradiol: 65.3 pg/mL

## 2023-11-04 ENCOUNTER — Ambulatory Visit (HOSPITAL_BASED_OUTPATIENT_CLINIC_OR_DEPARTMENT_OTHER): Payer: Managed Care, Other (non HMO) | Admitting: Obstetrics & Gynecology

## 2023-12-01 ENCOUNTER — Ambulatory Visit (HOSPITAL_BASED_OUTPATIENT_CLINIC_OR_DEPARTMENT_OTHER): Admitting: Orthopaedic Surgery

## 2023-12-28 ENCOUNTER — Other Ambulatory Visit (HOSPITAL_BASED_OUTPATIENT_CLINIC_OR_DEPARTMENT_OTHER): Payer: Self-pay | Admitting: Obstetrics & Gynecology

## 2023-12-28 DIAGNOSIS — N926 Irregular menstruation, unspecified: Secondary | ICD-10-CM

## 2024-01-13 ENCOUNTER — Encounter (HOSPITAL_BASED_OUTPATIENT_CLINIC_OR_DEPARTMENT_OTHER): Payer: Self-pay

## 2024-02-22 ENCOUNTER — Encounter (HOSPITAL_BASED_OUTPATIENT_CLINIC_OR_DEPARTMENT_OTHER): Payer: Self-pay | Admitting: Obstetrics & Gynecology

## 2024-02-22 ENCOUNTER — Other Ambulatory Visit (HOSPITAL_BASED_OUTPATIENT_CLINIC_OR_DEPARTMENT_OTHER): Payer: Self-pay

## 2024-02-22 DIAGNOSIS — Z9189 Other specified personal risk factors, not elsewhere classified: Secondary | ICD-10-CM

## 2024-02-28 ENCOUNTER — Ambulatory Visit (HOSPITAL_BASED_OUTPATIENT_CLINIC_OR_DEPARTMENT_OTHER): Admitting: Obstetrics & Gynecology

## 2024-03-17 ENCOUNTER — Other Ambulatory Visit (HOSPITAL_BASED_OUTPATIENT_CLINIC_OR_DEPARTMENT_OTHER): Payer: Self-pay | Admitting: Obstetrics & Gynecology

## 2024-03-17 DIAGNOSIS — N926 Irregular menstruation, unspecified: Secondary | ICD-10-CM

## 2024-03-28 ENCOUNTER — Encounter (HOSPITAL_BASED_OUTPATIENT_CLINIC_OR_DEPARTMENT_OTHER): Payer: Self-pay | Admitting: Obstetrics & Gynecology

## 2024-03-28 ENCOUNTER — Ambulatory Visit (INDEPENDENT_AMBULATORY_CARE_PROVIDER_SITE_OTHER): Admitting: Obstetrics & Gynecology

## 2024-03-28 VITALS — BP 133/80 | HR 81 | Ht 63.0 in | Wt 137.2 lb

## 2024-03-28 DIAGNOSIS — Z3041 Encounter for surveillance of contraceptive pills: Secondary | ICD-10-CM

## 2024-03-28 DIAGNOSIS — N926 Irregular menstruation, unspecified: Secondary | ICD-10-CM

## 2024-03-28 DIAGNOSIS — Z23 Encounter for immunization: Secondary | ICD-10-CM | POA: Diagnosis not present

## 2024-03-28 DIAGNOSIS — R9230 Dense breasts, unspecified: Secondary | ICD-10-CM

## 2024-03-28 DIAGNOSIS — R6882 Decreased libido: Secondary | ICD-10-CM | POA: Diagnosis not present

## 2024-03-28 DIAGNOSIS — Z803 Family history of malignant neoplasm of breast: Secondary | ICD-10-CM

## 2024-03-28 DIAGNOSIS — Z01419 Encounter for gynecological examination (general) (routine) without abnormal findings: Secondary | ICD-10-CM | POA: Diagnosis not present

## 2024-03-28 MED ORDER — DROSPIRENONE-ETHINYL ESTRADIOL 3-0.02 MG PO TABS: 1.0000 | ORAL_TABLET | Freq: Every day | ORAL | 3 refills | Status: AC

## 2024-03-28 NOTE — Addendum Note (Signed)
 Addended by: CLEOTILDE RONAL RAMAN on: 03/28/2024 10:58 AM   Modules accepted: Orders

## 2024-03-28 NOTE — Addendum Note (Signed)
 Addended by: VAN MORNA SAILOR on: 03/28/2024 11:01 AM   Modules accepted: Orders

## 2024-03-28 NOTE — Progress Notes (Signed)
 ANNUAL EXAM Patient name: Alice [name] MRN [id number]  Date of birth: 1975-09-21 Chief Complaint:   Gynecologic Exam (Patient reports that period has been irregular lately. She reports that blood has been dark, lots of clotting and occurring at irregular times. She also reports low libido.)  History of Present Illness:   Alice [name] is a 48 y.o. G57P0010 Caucasian female being seen today for a routine annual exam.  Still having monthly cycle but it is occurring during the middle of the pack.  Flow lasts about 5 days.  The last one was different because it was longer, lasted 7 days, and darker more like old blood.  She also reports low libido as well as vaginal dryness and frustrations with weight.  Using this for contraception.    Discussed Contrast Enhanced Mammogram for screening next year due to grade D breast density.  Mother also had of triple negative breast cancer in early 51's.    Last pap 10/27/2022. Results were: NILM w/ HRHPV negative. H/O abnormal pap: no Last mammogram: 03/25/2018. Results were: normal. Patient reports that she had mammorgram done at [facility] last week and results are still pending. Family h/o breast cancer: yes mother. Last colonoscopy: 08/10/2023. Results were normal. Family h/o colorectal cancer: no.  Follow up 10 years.       03/28/2024    9:54 AM 08/25/2023    2:57 PM 02/04/2023    3:46 PM 10/27/2022    1:26 PM 07/10/2020   10:40 AM  Depression screen PHQ 2/9  Decreased Interest 0 0 0 0 0  Down, Depressed, Hopeless 0 0 0 0 0  PHQ - 2 Score 0 0 0 0 0        10/12/2016    2:50 PM  GAD 7 : Generalized Anxiety Score  Nervous, Anxious, on Edge 1  Control/stop worrying 1  Worry too much - different things 1  Trouble relaxing 1  Restless 2  Easily annoyed or irritable 0  Afraid - awful might happen 0  Total GAD 7 Score 6     Review of Systems:   Pertinent items are noted in HPI Denies any headaches, blurred vision, fatigue, shortness of  breath, chest pain, abdominal pain, abnormal vaginal discharge/itching/odor/irritation, problems with periods, bowel movements, urination, or intercourse unless otherwise stated above. Pertinent History Reviewed:  Reviewed past medical,surgical, social and family history.  Reviewed problem list, medications and allergies. Physical Assessment:   Vitals:   03/28/24 0946  BP: 133/80  Pulse: 81  SpO2: 100%  Weight: 137 lb 3.2 oz (62.2 kg)  Height: 5' 3 (1.6 m)  Body mass index is 24.3 kg/m.        Physical Examination:   General appearance - well appearing, and in no distress  Mental status - alert, oriented to person, place, and time  Psych:  She has a normal mood and affect  Skin - warm and dry, normal color, no suspicious lesions noted  Chest - effort normal, all lung fields clear to auscultation bilaterally  Heart - normal rate and regular rhythm  Neck:  midline trachea, no thyromegaly or nodules  Breasts - breasts appear normal, no suspicious masses, no skin or nipple changes or  axillary nodes  Abdomen - soft, nontender, nondistended, no masses or organomegaly  Pelvic - VULVA: normal appearing vulva with no masses, tenderness or lesions   VAGINA: normal appearing vagina with normal color and discharge, no lesions   CERVIX: normal appearing cervix without discharge or  lesions, no CMT  Thin prep pap is   UTERUS: uterus is felt to be normal size, shape, consistency and nontender   ADNEXA: No adnexal masses or tenderness noted.  Rectal - deferred  Extremities:  No swelling or varicosities noted  Chaperone present for exam  No results found for this or any previous visit (from the past 24 hours).  Assessment & Plan:  1. Well woman exam with routine gynecological exam (Primary) - Pap smear 2024 - Mammogram done last week.  Report not finalized yet. - Colonoscopy 2025.  Follow up 10 years. - vaccines reviewed/updated  2. Irregular bleeding - Follicle stimulating  hormone  3. Encounter for surveillance of contraceptive pills - drospirenone -ethinyl estradiol  (VESTURA ) 3-0.02 MG tablet; Take 1 tablet by mouth daily.  Dispense: 84 tablet; Refill: 3  4.  Decreased libido - Testosterone, Total, LC/MS/MS  5. Breast density - dscussed [facility] next year for screening due to increased breast density  6. Family history of breast cancer   Orders Placed This Encounter  Procedures   Testosterone, Total, LC/MS/MS   Follicle stimulating hormone    Meds:  Meds ordered this encounter  Medications   drospirenone -ethinyl estradiol  (VESTURA ) 3-0.02 MG tablet    Sig: Take 1 tablet by mouth daily.    Dispense:  84 tablet    Refill:  3    Patient will receive additional refills at next office visit    Follow-up: Return in about 1 year (around 03/28/2025).  Ronal GORMAN Pinal, MD 03/28/2024 10:55 AM

## 2024-04-05 ENCOUNTER — Encounter (HOSPITAL_BASED_OUTPATIENT_CLINIC_OR_DEPARTMENT_OTHER): Payer: Self-pay | Admitting: Obstetrics & Gynecology

## 2024-04-17 ENCOUNTER — Encounter: Payer: Self-pay | Admitting: Radiology

## 2024-04-30 ENCOUNTER — Encounter (HOSPITAL_BASED_OUTPATIENT_CLINIC_OR_DEPARTMENT_OTHER): Payer: Self-pay | Admitting: Obstetrics & Gynecology
